# Patient Record
Sex: Female | Born: 1960 | Race: Black or African American | Hispanic: No | Marital: Single | Smoking: Current every day smoker
Health system: Southern US, Community
[De-identification: ages and names within clinical notes are randomized; demographics above are authoritative.]

## PROBLEM LIST (undated history)

## (undated) ENCOUNTER — Emergency Department (HOSPITAL_BASED_OUTPATIENT_CLINIC_OR_DEPARTMENT_OTHER): Admission: EM | Payer: Medicare Other | Source: Home / Self Care

## (undated) DIAGNOSIS — E119 Type 2 diabetes mellitus without complications: Secondary | ICD-10-CM

## (undated) DIAGNOSIS — I219 Acute myocardial infarction, unspecified: Secondary | ICD-10-CM

## (undated) DIAGNOSIS — I2510 Atherosclerotic heart disease of native coronary artery without angina pectoris: Secondary | ICD-10-CM

## (undated) DIAGNOSIS — I10 Essential (primary) hypertension: Secondary | ICD-10-CM

## (undated) HISTORY — PX: OTHER SURGICAL HISTORY: SHX169

## (undated) HISTORY — PX: CORONARY ARTERY BYPASS GRAFT: SHX141

---

## 2010-11-23 ENCOUNTER — Emergency Department (HOSPITAL_BASED_OUTPATIENT_CLINIC_OR_DEPARTMENT_OTHER)
Admission: EM | Admit: 2010-11-23 | Discharge: 2010-11-24 | Disposition: A | Discharge status: Home / Self Care | Payer: Self-pay | Attending: Emergency Medicine | Admitting: Emergency Medicine

## 2010-11-23 DIAGNOSIS — E7800 Pure hypercholesterolemia, unspecified: Secondary | ICD-10-CM | POA: Insufficient documentation

## 2010-11-23 DIAGNOSIS — B86 Scabies: Secondary | ICD-10-CM | POA: Insufficient documentation

## 2010-11-23 DIAGNOSIS — Z79899 Other long term (current) drug therapy: Secondary | ICD-10-CM | POA: Insufficient documentation

## 2010-11-23 DIAGNOSIS — I10 Essential (primary) hypertension: Secondary | ICD-10-CM | POA: Insufficient documentation

## 2010-11-23 DIAGNOSIS — I252 Old myocardial infarction: Secondary | ICD-10-CM | POA: Insufficient documentation

## 2014-04-15 ENCOUNTER — Emergency Department (HOSPITAL_BASED_OUTPATIENT_CLINIC_OR_DEPARTMENT_OTHER)
Admission: EM | Admit: 2014-04-15 | Discharge: 2014-04-15 | Disposition: A | Discharge status: Home / Self Care | Payer: Medicare Other | Attending: Emergency Medicine | Admitting: Emergency Medicine

## 2014-04-15 ENCOUNTER — Encounter (HOSPITAL_BASED_OUTPATIENT_CLINIC_OR_DEPARTMENT_OTHER): Payer: Self-pay | Admitting: Emergency Medicine

## 2014-04-15 ENCOUNTER — Emergency Department (HOSPITAL_BASED_OUTPATIENT_CLINIC_OR_DEPARTMENT_OTHER): Payer: Medicare Other

## 2014-04-15 DIAGNOSIS — J209 Acute bronchitis, unspecified: Secondary | ICD-10-CM | POA: Insufficient documentation

## 2014-04-15 DIAGNOSIS — R059 Cough, unspecified: Secondary | ICD-10-CM

## 2014-04-15 DIAGNOSIS — Z9861 Coronary angioplasty status: Secondary | ICD-10-CM | POA: Diagnosis not present

## 2014-04-15 DIAGNOSIS — R05 Cough: Secondary | ICD-10-CM

## 2014-04-15 DIAGNOSIS — Z880 Allergy status to penicillin: Secondary | ICD-10-CM | POA: Diagnosis not present

## 2014-04-15 DIAGNOSIS — I10 Essential (primary) hypertension: Secondary | ICD-10-CM | POA: Diagnosis not present

## 2014-04-15 DIAGNOSIS — E119 Type 2 diabetes mellitus without complications: Secondary | ICD-10-CM | POA: Insufficient documentation

## 2014-04-15 DIAGNOSIS — Z79899 Other long term (current) drug therapy: Secondary | ICD-10-CM | POA: Insufficient documentation

## 2014-04-15 DIAGNOSIS — I252 Old myocardial infarction: Secondary | ICD-10-CM | POA: Insufficient documentation

## 2014-04-15 DIAGNOSIS — J40 Bronchitis, not specified as acute or chronic: Secondary | ICD-10-CM

## 2014-04-15 HISTORY — DX: Type 2 diabetes mellitus without complications: E11.9

## 2014-04-15 HISTORY — DX: Essential (primary) hypertension: I10

## 2014-04-15 HISTORY — DX: Atherosclerotic heart disease of native coronary artery without angina pectoris: I25.10

## 2014-04-15 MED ORDER — ALBUTEROL SULFATE (2.5 MG/3ML) 0.083% IN NEBU
5.0000 mg | INHALATION_SOLUTION | Freq: Once | RESPIRATORY_TRACT | Status: AC
  Administered 2014-04-15: 5 mg via RESPIRATORY_TRACT
  Filled 2014-04-15: qty 6

## 2014-04-15 MED ORDER — PREDNISONE 50 MG PO TABS
60.0000 mg | ORAL_TABLET | Freq: Once | ORAL | Status: AC
  Administered 2014-04-15: 60 mg via ORAL
  Filled 2014-04-15 (×2): qty 1

## 2014-04-15 MED ORDER — AZITHROMYCIN 250 MG PO TABS: ORAL_TABLET | ORAL | Status: AC

## 2014-04-15 MED ORDER — PREDNISONE 20 MG PO TABS: ORAL_TABLET | ORAL | Status: AC

## 2014-04-15 MED ORDER — BENZONATATE 100 MG PO CAPS: 100.0000 mg | ORAL_CAPSULE | Freq: Three times a day (TID) | ORAL | Status: AC | PRN

## 2014-04-15 MED ORDER — ALBUTEROL SULFATE HFA 108 (90 BASE) MCG/ACT IN AERS
2.0000 | INHALATION_SPRAY | Freq: Once | RESPIRATORY_TRACT | Status: AC
  Administered 2014-04-15: 2 via RESPIRATORY_TRACT
  Filled 2014-04-15: qty 6.7

## 2014-04-15 NOTE — Discharge Instructions (Signed)
Take prednisone as prescribed.   Use Zpack as prescribed.   Stay hydrated.   Use albuterol every 4 hrs as needed.   Try tessalon pearls as needed, especially at night.   Watch your blood sugar closely as it may go up with prednisone.   Follow up with your doctor.   Return to ER if you have trouble breathing, cough, chest pain.

## 2014-04-15 NOTE — ED Notes (Signed)
Patient states she developed a head cold one week ago and improved.  Now has productive cough with white to dark secretions.  Coughing is keeping the patient awake at night. Denies fevers.

## 2014-04-15 NOTE — ED Provider Notes (Signed)
CSN: [id number]     Arrival date & time 04/15/14  1530 History   First MD Initiated Contact with Patient 04/15/14 1554     Chief Complaint  Patient presents with  . Cough     (Consider location/radiation/quality/duration/timing/severity/associated sxs/prior Treatment) The history is provided by the patient.  Nicole CrumbleVivian Chirico is a 53 y.o. female hx of MI, DM, HTN here with cough and congestion. Has been having productive cough with white secretions over the last week. She has coughed in the day but at night it wakes her up. He tried some over-the-counter meds with no relief. She had some chest pain or week ago that resolved. States that this is different than her previous MIs. She saw her cardiologist today it was cleared by her cardiologist. Denies any fevers. She stopped smoking for a while but has recently started.    Past Medical History  Diagnosis Date  . MI     x 3  . Diabetes mellitus without complication   . Hypertension    Past Surgical History  Procedure Laterality Date  . Cesarean section      x 4  . Cardiac stents      x 3   No family history on file. History  Substance Use Topics  . Smoking status: Not on file  . Smokeless tobacco: Not on file  . Alcohol Use: Not on file   OB History   Grav Para Term Preterm Abortions TAB SAB Ect Mult Living                 Review of Systems  Respiratory: Positive for cough.   All other systems reviewed and are negative.     Allergies  Penicillins  Home Medications   Prior to Admission medications   Medication Sig Start Date End Date Taking? Authorizing Provider  aspirin 81 MG tablet Take 81 mg by mouth daily.   Yes Historical Provider, MD  atorvastatin (LIPITOR) 80 MG tablet Take 80 mg by mouth daily.   Yes Historical Provider, MD  clopidogrel (PLAVIX) 75 MG tablet Take 75 mg by mouth daily.   Yes Historical Provider, MD  gabapentin (NEURONTIN) 100 MG capsule Take 100 mg by mouth 3 (three) times daily.   Yes  Historical Provider, MD  lisinopril (PRINIVIL,ZESTRIL) 2.5 MG tablet Take 2.5 mg by mouth daily.   Yes Historical Provider, MD  metFORMIN (GLUCOPHAGE) 500 MG tablet Take by mouth 2 (two) times daily with a meal.   Yes Historical Provider, MD  metoprolol (LOPRESSOR) 50 MG tablet Take 50 mg by mouth 2 (two) times daily.   Yes Historical Provider, MD  nitroGLYCERIN (NITROSTAT) 0.4 MG SL tablet Place 0.4 mg under the tongue every 5 (five) minutes as needed for chest pain.   Yes Historical Provider, MD  sertraline (ZOLOFT) 50 MG tablet Take 50 mg by mouth daily.   Yes Historical Provider, MD   BP 162/82  Pulse 86  Temp(Src) 98.5 F (36.9 C) (Oral)  Resp 18  Ht 5' (1.524 m)  Wt 185 lb (83.915 kg)  BMI 36.13 kg/m2  SpO2 100% Physical Exam  Nursing note and vitals reviewed. Constitutional: She is oriented to person, place, and time.  Chronically ill, tired   HENT:  Head: Normocephalic.  Mouth/Throat: Oropharynx is clear and moist.  Eyes: Conjunctivae and EOM are normal. Pupils are equal, round, and reactive to light.  Neck: Normal range of motion. Neck supple.  Cardiovascular: Normal rate, regular rhythm and normal heart sounds.  Pulmonary/Chest:  + diffuse wheezing, no crackles. No retractions. Slightly tachypneic   Abdominal: Soft. Bowel sounds are normal. She exhibits no distension. There is no tenderness. There is no rebound and no guarding.  Musculoskeletal: Normal range of motion. She exhibits no edema and no tenderness.  Neurological: She is alert and oriented to person, place, and time. No cranial nerve deficit. Coordination normal.  Skin: Skin is warm.  Psychiatric: She has a normal mood and affect. Her behavior is normal. Judgment and thought content normal.    ED Course  Procedures (including critical care time) Labs Review Labs Reviewed - No data to display  Imaging Review Dg Chest 2 View  04/15/2014   CLINICAL DATA:  Head cold for 1 week, productive cough  EXAM: CHEST   2 VIEW  COMPARISON:  None.  FINDINGS: The heart size and mediastinal contours are within normal limits. Both lungs are clear. The visualized skeletal structures are unremarkable.  IMPRESSION: No active cardiopulmonary disease.   Electronically Signed   By: Elige KoHetal  Patel   On: 04/15/2014 16:07     EKG Interpretation None      MDM   Final diagnoses:  Cough    Nicole Solis is a 53 y.o. female here with cough, congestion. I think likely bronchitis vs pneumonia. No chest pain now. I doubt MI or ACS.   4:41 PM CXR showed no obvious infiltrate. Minimal wheezing after 1 neb. Given that she is a smoker, will give albuterol, prednisone, zpack empirically. Told her to watch her sugars closely and stop smoking.   Richardean Canalavid H Porter Moes, MD 04/15/14 [phone]

## 2014-07-11 ENCOUNTER — Emergency Department (HOSPITAL_BASED_OUTPATIENT_CLINIC_OR_DEPARTMENT_OTHER)
Admission: EM | Admit: 2014-07-11 | Discharge: 2014-07-11 | Disposition: A | Discharge status: Home / Self Care | Payer: Medicare Other | Attending: Emergency Medicine | Admitting: Emergency Medicine

## 2014-07-11 ENCOUNTER — Encounter (HOSPITAL_BASED_OUTPATIENT_CLINIC_OR_DEPARTMENT_OTHER): Payer: Self-pay | Admitting: *Deleted

## 2014-07-11 DIAGNOSIS — I10 Essential (primary) hypertension: Secondary | ICD-10-CM | POA: Diagnosis not present

## 2014-07-11 DIAGNOSIS — Z7902 Long term (current) use of antithrombotics/antiplatelets: Secondary | ICD-10-CM | POA: Diagnosis not present

## 2014-07-11 DIAGNOSIS — Z79899 Other long term (current) drug therapy: Secondary | ICD-10-CM | POA: Diagnosis not present

## 2014-07-11 DIAGNOSIS — Z720 Tobacco use: Secondary | ICD-10-CM | POA: Diagnosis not present

## 2014-07-11 DIAGNOSIS — Z7982 Long term (current) use of aspirin: Secondary | ICD-10-CM | POA: Diagnosis not present

## 2014-07-11 DIAGNOSIS — K0889 Other specified disorders of teeth and supporting structures: Secondary | ICD-10-CM

## 2014-07-11 DIAGNOSIS — K088 Other specified disorders of teeth and supporting structures: Secondary | ICD-10-CM | POA: Insufficient documentation

## 2014-07-11 DIAGNOSIS — I252 Old myocardial infarction: Secondary | ICD-10-CM | POA: Diagnosis not present

## 2014-07-11 DIAGNOSIS — Z880 Allergy status to penicillin: Secondary | ICD-10-CM | POA: Diagnosis not present

## 2014-07-11 DIAGNOSIS — E119 Type 2 diabetes mellitus without complications: Secondary | ICD-10-CM | POA: Diagnosis not present

## 2014-07-11 HISTORY — DX: Acute myocardial infarction, unspecified: I21.9

## 2014-07-11 MED ORDER — CLINDAMYCIN HCL 150 MG PO CAPS: 150.0000 mg | ORAL_CAPSULE | Freq: Three times a day (TID) | ORAL | Status: AC

## 2014-07-11 MED ORDER — HYDROCODONE-ACETAMINOPHEN 5-325 MG PO TABS
1.0000 | ORAL_TABLET | Freq: Once | ORAL | Status: AC
  Administered 2014-07-11: 1 via ORAL
  Filled 2014-07-11: qty 1

## 2014-07-11 MED ORDER — CLINDAMYCIN HCL 150 MG PO CAPS
300.0000 mg | ORAL_CAPSULE | Freq: Once | ORAL | Status: AC
  Administered 2014-07-11: 300 mg via ORAL
  Filled 2014-07-11: qty 2

## 2014-07-11 MED ORDER — HYDROCODONE-ACETAMINOPHEN 5-325 MG PO TABS: 1.0000 | ORAL_TABLET | Freq: Four times a day (QID) | ORAL | Status: DC | PRN

## 2014-07-11 NOTE — ED Notes (Signed)
C/o bottom tooth pain that started around Christmas but worse this past week. States entire left side of face hurting. Pt. Only has one tooth on the top left and none on bottom back. States she had these teeth removed 2 years ago. Denies fevers. Describes as a throbbing type pain which is worse at night. No other complaints. States she feels like her face is swollen.

## 2014-07-11 NOTE — ED Provider Notes (Signed)
CSN: [id number]     Arrival date & time 07/11/14  0516 History   First MD Initiated Contact with Patient 07/11/14 0602     Chief Complaint  Patient presents with  . Dental Pain     (Consider location/radiation/quality/duration/timing/severity/associated sxs/prior Treatment) HPI This is a 54 year old female who has very few teeth remaining after multiple extractions about 2 years ago. She is here complaining of pain at the site of a left lower molar extraction. She states the pain has been present since Christmas but has worsened over the last week. She describes it as "excruciating" and throbbing in nature, worse at night. It is worse with eating. It radiates to the left side of her face. She feels like the left side of her face is swollen. She denies a fever.  Past Medical History  Diagnosis Date  . MI     x 3  . Diabetes mellitus without complication   . Hypertension   . Myocardial infarct    Past Surgical History  Procedure Laterality Date  . Cesarean section      x 4  . Cardiac stents      x 3   No family history on file. History  Substance Use Topics  . Smoking status: Current Every Day Smoker  . Smokeless tobacco: Not on file  . Alcohol Use: No   OB History    No data available     Review of Systems  All other systems reviewed and are negative.   Allergies  Penicillins  Home Medications   Prior to Admission medications   Medication Sig Start Date End Date Taking? Authorizing Provider  aspirin 81 MG tablet Take 81 mg by mouth daily.    Historical Provider, MD  atorvastatin (LIPITOR) 80 MG tablet Take 80 mg by mouth daily.    Historical Provider, MD  azithromycin (ZITHROMAX Z-PAK) 250 MG tablet 2 po day one, then 1 daily x 4 days 04/15/14   Richardean Canalavid H Yao, MD  benzonatate (TESSALON) 100 MG capsule Take 1 capsule (100 mg total) by mouth 3 (three) times daily as needed for cough. 04/15/14   Richardean Canalavid H Yao, MD  clopidogrel (PLAVIX) 75 MG tablet Take 75 mg by mouth  daily.    Historical Provider, MD  gabapentin (NEURONTIN) 100 MG capsule Take 100 mg by mouth 3 (three) times daily.    Historical Provider, MD  lisinopril (PRINIVIL,ZESTRIL) 2.5 MG tablet Take 2.5 mg by mouth daily.    Historical Provider, MD  metFORMIN (GLUCOPHAGE) 500 MG tablet Take by mouth 2 (two) times daily with a meal.    Historical Provider, MD  metoprolol (LOPRESSOR) 50 MG tablet Take 50 mg by mouth 2 (two) times daily.    Historical Provider, MD  nitroGLYCERIN (NITROSTAT) 0.4 MG SL tablet Place 0.4 mg under the tongue every 5 (five) minutes as needed for chest pain.    Historical Provider, MD  predniSONE (DELTASONE) 20 MG tablet Take 40 mg daily x 2 days then 20 mg daily x 2 days 04/15/14   Richardean Canalavid H Yao, MD  sertraline (ZOLOFT) 50 MG tablet Take 50 mg by mouth daily.    Historical Provider, MD   BP 124/66 mmHg  Pulse 61  Temp(Src) 98.5 F (36.9 C) (Oral)  Resp 18  SpO2 98%   Physical Exam  General: Well-developed, well-nourished female in no acute distress; appearance consistent with age of record HENT: normocephalic; atraumatic; multiple missing teeth; tenderness at site of left lower second or third molar  without appreciable swelling or fluctuance Eyes: pupils equal, round and reactive to light; extraocular muscles intact Neck: supple; no lymphadenopathy Heart: regular rate and rhythm Lungs: clear to auscultation bilaterally Abdomen: soft; nondistended Extremities: No deformity; full range of motion Neurologic: Awake, alert and oriented; motor function intact in all extremities and symmetric; no facial droop Skin: Warm and dry Psychiatric: Normal mood and affect    ED Course  Procedures (including critical care time)   MDM  6:12 AM Patient was advised she needs to follow-up with a dentist as she likely needs dental x-rays which we are not set up to perform. We will treat her symptomatically in the meantime as well as provide an antibiotic for possible infectious  etiology.  Hanley Seamen, MD 07/11/14 [phone]

## 2014-07-11 NOTE — ED Notes (Signed)
MD with pt  

## 2014-07-11 NOTE — Discharge Instructions (Signed)
°Emergency Department Resource Guide °1) Find a Doctor and Pay Out of Pocket °Although you won't have to find out who is covered by your insurance plan, it is a good idea to ask around and get recommendations. You will then need to call the office and see if the doctor you have chosen will accept you as a new patient and what types of options they offer for patients who are self-pay. Some doctors offer discounts or will set up payment plans for their patients who do not have insurance, but you will need to ask so you aren't surprised when you get to your appointment. ° °2) Contact Your Local Health Department °Not all health departments have doctors that can see patients for sick visits, but many do, so it is worth a call to see if yours does. If you don't know where your local health department is, you can check in your phone book. The CDC also has a tool to help you locate your state's health department, and many state websites also have listings of all of their local health departments. ° °3) Find a Walk-in Clinic °If your illness is not likely to be very severe or complicated, you may want to try a walk in clinic. These are popping up all over the country in pharmacies, drugstores, and shopping centers. They're usually staffed by nurse practitioners or physician assistants that have been trained to treat common illnesses and complaints. They're usually fairly quick and inexpensive. However, if you have serious medical issues or chronic medical problems, these are probably not your best option. ° °No Primary Care Doctor: °- Call [facility] at  832-8000 - they can help you locate a primary care doctor that  accepts your insurance, provides certain services, etc. °- Physician Referral Service- 1-800-533-3463 ° °Chronic Pain Problems: °Organization         Address  Phone   Notes  °[facility]  ([phone]) [phone] Patients need to be referred by their primary care doctor.  ° °Medication  Assistance: °Organization         Address  Phone   Notes  °[place] [facility] [address] °[place], [place] [address] (336) 641-8030 --Must be a resident of [place] °-- Must have NO insurance coverage whatsoever (no Medicaid/ Medicare, etc.) °-- The pt. MUST have a primary care doctor that directs their care regularly and follows them in the community °  °[facility]  (866) 331-1348   °[employer]  (888) 892-1162   ° °Agencies that provide inexpensive medical care: °Organization         Address  Phone   Notes  °[facility]  (336) 832-8035   °[facility]    (336) 832-7272   °[facility] [address] °[place], [place] [address] (336) 832-4777   °[facility] [address], °[place] (336) 271-4999   °[facility]    (336) 373-0678   °[facility]    (336) 272-1050   °[facility] ° [address], [place] Phone:  (336) 832-4444, Fax:  (336) 832-4440 Hours of Operation:  9 am - 6 pm, M-F.  Also accepts Medicaid/Medicare and self-pay.  °[facility] ° [address], [place] Phone: (336) [phone], Fax: (336) [phone]. Hours of Operation:  8:30 am - 5:30 pm, M-F.  Also accepts Medicaid and self-pay.  °[facility] [place] [address], [place] Phone: (336) 878-6027   °[facility] [address], [place], [place] (336)723-1848, Ext. 123 Mondays & Thursdays: 7-9 AM.  First 15 patients are seen on a first come, first serve basis. °  ° °Medicaid-accepting [place] Providers: ° °Organization         Address  Phone   Notes  °[facility] [address], [place] (336) 641-2100 Also accepts self-pay patients.  °[facility] [address], [place] ° (336) 856-9996   °[facility] [address], [place]  (336) 288-8857   °[facility] [address] [place] [address], [place] (336) 299-7000   °Veita Bland [address], [place]  ° (336) 373-1557 Only accepts [place] Access Medicaid patients after they have their name applied to their card.  ° °Self-Pay (no insurance) in [place]: ° °Organization         Address  Phone   Notes  °Sickle Cell Patients, [facility] [address], [place] (336) 832-1970   °[facility] [address], [place] (336) 832-4400   °[facility] ° [address], [place] (336) 992-4800   °[facility]/Dr. Osei-Bonsu ° [address] [place] [address], [place] or [address], [place] (336) [phone] Phone number for both [place] and [place] locations is the same.  °[facility] [address], [place] (336) 299-0000   °[facility] [address] [place] [address], [place] or [address] (336) 852-7530 °(336) 878-2260   °[facility] [address], [place] (336) 350-1642, phone; (336) 294-5005, fax Sees patients 1st and 3rd Saturday of every month.  Must not qualify for public or private insurance (i.e. Medicaid, Medicare, [place] Health Choice, Veterans' Benefits) • Household income should be no more than 200% of the poverty level •The clinic cannot treat you if you are pregnant or think you are pregnant • Sexually transmitted diseases are not treated at the clinic.  ° ° °Dental Care: °Organization         Address  Phone  Notes  °[place] [facility] [address], [place] (336) 641-6152 Accepts children up to age 21 who are enrolled in Medicaid or [place] Health Choice; pregnant women with a Medicaid card; and children who have applied for Medicaid or [place] Health Choice, but were declined, whose parents can pay a reduced fee at time of service.  °[place]  [facility] [place]  [address], [place] (336) 641-7733 Accepts children up to age 21 who are enrolled in Medicaid or [place] Health Choice; pregnant women with a Medicaid card; and children who have applied for Medicaid or [place] Health Choice, but were declined, whose parents can pay a reduced fee at time of service.  °[facility] ° [address], [place] (336) 641-4533 Patients are seen by appointment only. Walk-ins are not accepted. [facility] will see patients 18 years of age and older. °Monday - Tuesday (8am-5pm) °Most Wednesdays (8:30-5pm) °$30 per visit, cash only  °[facility] ° [address], [place] (336) 641-4533 Patients are seen by appointment only. Walk-ins are not accepted. [facility] will see patients 18 years of age and older. °One   Wednesday Evening (Monthly: Volunteer Based).  $30 per visit, cash only  °[facility]  (919) 537-3737 for adults; Children under age 4, call Graduate Pediatric Dentistry at (919) [phone]. Children aged 4-14, please call (919) [phone] to request a pediatric application. ° Dental services are provided in all areas of dental care including fillings, crowns and bridges, complete and partial dentures, implants, gum treatment, root canals, and extractions. Preventive care is also provided. Treatment is provided to both adults and children. °Patients are selected via a lottery and there is often a waiting list. °  °[facility] [address], °[place] ° (336) 763-8833 [email or web address] °  °[facility] [address], [place], [place] (336)723-1848, Ext. 123 Second and Fourth Thursday of each month, opens at 6:30 AM; Clinic ends at 9 AM.  Patients are seen on a first-come first-served basis, and a limited number are seen during each clinic.  ° °[facility] ° [address], [place], [place] (336) 723-7904    Eligibility Requirements °You must have lived in [place], [place], or [place] counties for at least the last three months. °  You cannot be eligible for state or federal sponsored healthcare insurance, including Veterans Administration, Medicaid, or Medicare. °  You generally cannot be eligible for healthcare insurance through your employer.  °  How to apply: °Eligibility screenings are held every Tuesday and Wednesday afternoon from 1:00 pm until 4:00 pm. You do not need an appointment for the interview!  °[facility] [address], [place], [place] 336-631-2330   °[facility]  336-342-8273   °[facility]  336-703-3100   °[facility]  336-570-6415   ° °Behavioral Health Resources in the Community: °Intensive Outpatient Programs °Organization         Address  Phone  Notes  °[facility] [address], [place], [place] 336-878-6098   °[facility] [address], [place], [place] 336-832-9800   °[facility] [address], [place], [place] ° 336-882-2125   °[facility] [address],  °[place], [place] 1-800-853-5163 or 336-641-4981   °Substance Abuse Resources °Organization         Address  Phone  Notes  °[facility]  336-882-2125   °[facility]  336-784-9470   °[facility]  336-285-9073   °[facility]  336-845-3988   °Residential & Outpatient Substance Abuse Program  1-800-659-3381   °Psychological Services °Organization         Address  Phone  Notes  °[facility]  336- 832-9600   °[facility]  336- 378-7881   °[facility] [address], [place] 1-800-853-5163 or 336-641-4981   ° °Mobile Crisis Teams °Organization         Address  Phone  Notes  °[facility], Mobile Crisis Care Unit  1-877-626-1772   °Assertive °[facility] ° [address]  [place], [place] 336-834-9664   °Sharon DeEsch [address] °[place] 336-554-5454   ° °Self-Help/Support Groups °Organization         Address  Phone             Notes  °[facility] - variety of support groups  336- 373-1402 Call for more information  °Narcotics Anonymous (NA), [facility] [address], °[place]  2 meetings at this location  ° °  Residential Treatment Programs °Organization         Address  Phone  Notes  °[facility] [address],    °[place]  1-866-801-8205   °[facility] ° [address], [place], [place] 704-293-8524   °[facility] [address], [place] 336-845-3988 Admissions: 8am-3pm M-F  °[facility] [address]    °[place], [place] 336-841-1104   °[facility] [address], [place], [place] 336-379-7146   °[facility] [address]  °[place], [place] 336-285-9073   °[facility] - Intensive Outpatient [address], [place], [place] 336-852-3033   °[facility] [address]  °[place], [place] 1-877-615-2722 or 336-784-9470   °[facility] [address] [place], [place] 336-227-7417 Accepts Medicaid  °[facility] [address]  °[place] 1-800-659-3381 Substance Abuse/Addiction Treatment  ° °[place] Behavioral Health Resources °Organization         Address  Phone  Notes  °[facility]  (888) 581-9988   °Julie Brannon, PhD [address] [place], [place]   (336) 349-5553 or (336) 951-0000   °[facility]   [address] °[place], [place] (336) 349-4454   °[facility] [address], [place], [place] (336) 342-8316 Insurance/Medicaid/sponsorship through [facility]  °[facility] [address]                                    [place], [place] (336) 342-8316 Therapy/tele-psych/case    °[facility] [address]  ° [place], [place] (336) 349-2233    °Dr. Arfeen  (336) 349-4544   °[facility] [place]  [employer] [place] [facility] 1) [address],  °2) [address], [place] °3)  [address], [place] (336) 349-3220 °(336) 342-7768 ° °(336) 342-8140   °[place] Child Abuse Hotline (336) 342-1394 or (336) 342-3537 (After Hours)    ° ° °

## 2014-09-04 ENCOUNTER — Encounter (HOSPITAL_BASED_OUTPATIENT_CLINIC_OR_DEPARTMENT_OTHER): Payer: Self-pay

## 2014-09-04 ENCOUNTER — Emergency Department (HOSPITAL_BASED_OUTPATIENT_CLINIC_OR_DEPARTMENT_OTHER)
Admission: EM | Admit: 2014-09-04 | Discharge: 2014-09-05 | Disposition: A | Discharge status: Home / Self Care | Payer: Medicare Other | Attending: Emergency Medicine | Admitting: Emergency Medicine

## 2014-09-04 DIAGNOSIS — Z9861 Coronary angioplasty status: Secondary | ICD-10-CM | POA: Diagnosis not present

## 2014-09-04 DIAGNOSIS — Z79899 Other long term (current) drug therapy: Secondary | ICD-10-CM | POA: Diagnosis not present

## 2014-09-04 DIAGNOSIS — E119 Type 2 diabetes mellitus without complications: Secondary | ICD-10-CM | POA: Diagnosis not present

## 2014-09-04 DIAGNOSIS — Z880 Allergy status to penicillin: Secondary | ICD-10-CM | POA: Insufficient documentation

## 2014-09-04 DIAGNOSIS — Z7982 Long term (current) use of aspirin: Secondary | ICD-10-CM | POA: Diagnosis not present

## 2014-09-04 DIAGNOSIS — I10 Essential (primary) hypertension: Secondary | ICD-10-CM | POA: Insufficient documentation

## 2014-09-04 DIAGNOSIS — M25551 Pain in right hip: Secondary | ICD-10-CM | POA: Diagnosis not present

## 2014-09-04 DIAGNOSIS — G629 Polyneuropathy, unspecified: Secondary | ICD-10-CM | POA: Diagnosis not present

## 2014-09-04 DIAGNOSIS — I2510 Atherosclerotic heart disease of native coronary artery without angina pectoris: Secondary | ICD-10-CM | POA: Diagnosis not present

## 2014-09-04 DIAGNOSIS — Z951 Presence of aortocoronary bypass graft: Secondary | ICD-10-CM | POA: Insufficient documentation

## 2014-09-04 DIAGNOSIS — M25552 Pain in left hip: Secondary | ICD-10-CM | POA: Diagnosis not present

## 2014-09-04 DIAGNOSIS — I252 Old myocardial infarction: Secondary | ICD-10-CM | POA: Diagnosis not present

## 2014-09-04 DIAGNOSIS — Z720 Tobacco use: Secondary | ICD-10-CM | POA: Diagnosis not present

## 2014-09-04 DIAGNOSIS — M79605 Pain in left leg: Secondary | ICD-10-CM | POA: Diagnosis present

## 2014-09-04 MED ORDER — HYDROCODONE-ACETAMINOPHEN 5-325 MG PO TABS: 1.0000 | ORAL_TABLET | Freq: Four times a day (QID) | ORAL | Status: AC | PRN

## 2014-09-04 MED ORDER — OXYCODONE-ACETAMINOPHEN 5-325 MG PO TABS
1.0000 | ORAL_TABLET | Freq: Once | ORAL | Status: AC
  Administered 2014-09-04: 1 via ORAL
  Filled 2014-09-04: qty 1

## 2014-09-04 NOTE — Discharge Instructions (Signed)
Continue regular medications. Take prescribed pain medication for severe pain only. Follow up with your primary care doctor and orthopedics as referred. Return if worsening symptoms.   Hip Pain Your hip is the joint between your upper legs and your lower pelvis. The bones, cartilage, tendons, and muscles of your hip joint perform a lot of work each day supporting your body weight and allowing you to move around. Hip pain can range from a minor ache to severe pain in one or both of your hips. Pain may be felt on the inside of the hip joint near the groin, or the outside near the buttocks and upper thigh. You may have swelling or stiffness as well.  HOME CARE INSTRUCTIONS   Take medicines only as directed by your health care provider.  Apply ice to the injured area:  Put ice in a plastic bag.  Place a towel between your skin and the bag.  Leave the ice on for 15-20 minutes at a time, 3-4 times a day.  Keep your leg raised (elevated) when possible to lessen swelling.  Avoid activities that cause pain.  Follow specific exercises as directed by your health care provider.  Sleep with a pillow between your legs on your most comfortable side.  Record how often you have hip pain, the location of the pain, and what it feels like. SEEK MEDICAL CARE IF:   You are unable to put weight on your leg.  Your hip is red or swollen or very tender to touch.  Your pain or swelling continues or worsens after 1 week.  You have increasing difficulty walking.  You have a fever. SEEK IMMEDIATE MEDICAL CARE IF:   You have fallen.  You have a sudden increase in pain and swelling in your hip. MAKE SURE YOU:   Understand these instructions.  Will watch your condition.  Will get help right away if you are not doing well or get worse. Document Released: 11/29/2009 Document Revised: 10/26/2013 Document Reviewed: 02/05/2013 [facility] Patient Information 2015 [place], [place]. This information is not  intended to replace advice given to you by your health care provider. Make sure you discuss any questions you have with your health care provider.

## 2014-09-04 NOTE — ED Notes (Signed)
Pt reports multiple months with neuropathy pain in bilateral legs with leg swelling bilateral.  States pain worse now and going from hips down to feet.  Reports her prescribed neurontin not working for pain control.  No difficulty ambulating.

## 2014-09-04 NOTE — ED Provider Notes (Signed)
CSN: [id number]     Arrival date & time 09/04/14  2045 History   First MD Initiated Contact with Patient 09/04/14 2302     Chief Complaint  Patient presents with  . Leg Pain     (Consider location/radiation/quality/duration/timing/severity/associated sxs/prior Treatment) HPI Nicole Solis is a 54 y.o. femalewith hx of CAD, DM, HTN, presents to ED with complaint of pain to bilateral legs and hips. States pain for several years, worse in the last month. Was seen by PCP 5 days ago. States they increased her neurontin dose. States it is not helping. Now with increased bilateral hip pain. States she has not had issues with her hips in the past. Pain worsened with movement of bilateral hips and walking. States "I can hardly get up." States she is only able to go to church because of pain. She denies any other associated symptoms. No weakness, no loss of bladder or bowel control, no fever. No abdominal pain or back pain.   Past Medical History  Diagnosis Date  . MI     x 3  . Diabetes mellitus without complication   . Hypertension   . Myocardial infarct    Past Surgical History  Procedure Laterality Date  . Cesarean section      x 4  . Cardiac stents      x 3  . Coronary artery bypass graft     No family history on file. History  Substance Use Topics  . Smoking status: Current Every Day Smoker -- 0.25 packs/day    Types: Cigarettes  . Smokeless tobacco: Not on file  . Alcohol Use: No   OB History    No data available     Review of Systems  Constitutional: Negative for fever and chills.  Respiratory: Negative for cough, chest tightness and shortness of breath.   Cardiovascular: Negative for chest pain, palpitations and leg swelling.  Gastrointestinal: Negative for nausea, vomiting, abdominal pain and diarrhea.  Genitourinary: Negative for dysuria, flank pain and pelvic pain.  Musculoskeletal: Positive for myalgias and arthralgias. Negative for neck pain and neck stiffness.   Skin: Negative for rash.  Neurological: Negative for dizziness, weakness, numbness and headaches.  All other systems reviewed and are negative.     Allergies  Penicillins  Home Medications   Prior to Admission medications   Medication Sig Start Date End Date Taking? Authorizing Provider  aspirin 81 MG tablet Take 81 mg by mouth daily.    Historical Provider, MD  atorvastatin (LIPITOR) 80 MG tablet Take 80 mg by mouth daily.    Historical Provider, MD  azithromycin (ZITHROMAX Z-PAK) 250 MG tablet 2 po day one, then 1 daily x 4 days 04/15/14   Richardean Canal, MD  benzonatate (TESSALON) 100 MG capsule Take 1 capsule (100 mg total) by mouth 3 (three) times daily as needed for cough. 04/15/14   Richardean Canal, MD  clindamycin (CLEOCIN) 150 MG capsule Take 1 capsule (150 mg total) by mouth 3 (three) times daily. 07/11/14   John Molpus, MD  clopidogrel (PLAVIX) 75 MG tablet Take 75 mg by mouth daily.    Historical Provider, MD  gabapentin (NEURONTIN) 100 MG capsule Take 100 mg by mouth 3 (three) times daily.    Historical Provider, MD  HYDROcodone-acetaminophen (NORCO/VICODIN) 5-325 MG per tablet Take 1-2 tablets by mouth every 6 (six) hours as needed (for pain). 07/11/14   John Molpus, MD  lisinopril (PRINIVIL,ZESTRIL) 2.5 MG tablet Take 2.5 mg by mouth daily.  Historical Provider, MD  metFORMIN (GLUCOPHAGE) 500 MG tablet Take by mouth 2 (two) times daily with a meal.    Historical Provider, MD  metoprolol (LOPRESSOR) 50 MG tablet Take 50 mg by mouth 2 (two) times daily.    Historical Provider, MD  nitroGLYCERIN (NITROSTAT) 0.4 MG SL tablet Place 0.4 mg under the tongue every 5 (five) minutes as needed for chest pain.    Historical Provider, MD  predniSONE (DELTASONE) 20 MG tablet Take 40 mg daily x 2 days then 20 mg daily x 2 days 04/15/14   Richardean Canalavid H Yao, MD  sertraline (ZOLOFT) 50 MG tablet Take 50 mg by mouth daily.    Historical Provider, MD   BP 118/60 mmHg  Pulse 68  Temp(Src) 98.5 F (36.9  C) (Oral)  Resp 18  Ht 5' (1.524 m)  Wt 176 lb (79.833 kg)  BMI 34.37 kg/m2  SpO2 96% Physical Exam  Constitutional: She is oriented to person, place, and time. She appears well-developed and well-nourished. No distress.  HENT:  Head: Normocephalic.  Eyes: Conjunctivae are normal.  Neck: Neck supple.  Cardiovascular: Normal rate, regular rhythm and normal heart sounds.   Pulmonary/Chest: Effort normal and breath sounds normal. No respiratory distress. She has no wheezes. She has no rales.  Abdominal: Soft. Bowel sounds are normal. She exhibits no distension. There is no tenderness. There is no rebound.  Musculoskeletal: She exhibits no edema.  Normal appearing bilateral legs. Normal and full rom of bilateral ankles and knees. TTP over bilateral lateral hips over greater trochanters. Pain with bilateral hip flexion, internal and external rotations.   Neurological: She is alert and oriented to person, place, and time.  5/5 and equal lower extremity strength. 2+ and equal patellar reflexes bilaterally. Pt able to dorsiflex bilateral toes and feet with good strength against resistance. Equal sensation bilaterally over thighs and lower legs.   Skin: Skin is warm and dry.  Psychiatric: She has a normal mood and affect. Her behavior is normal.  Nursing note and vitals reviewed.   ED Course  Procedures (including critical care time) Labs Review Labs Reviewed - No data to display  Imaging Review No results found.   EKG Interpretation None      MDM   Final diagnoses:  Bilateral hip pain  Neuropathy   patient with chronic neuropathy of bilateral legs. Followed by her primary care doctor. Patient is not happy with his treatment, states he is not treating her pain very well. I explained to her that she may need to go to a specialist or pain management for further pain managing. States the hip pain however is new. On my exam patient has tenderness to bilateral hips, pain with range of  motion in any direction. Question bursitis versus osteoarthritis. She is ambulatory. No injuries. Do not think any imaging indicated a nonemergent basis. Patient is requesting orthopedics referral. I will refer her to a sports medicine physician, she may benefit from some physical therapy. Instructed to also follow-up with her primary care doctor and explained to him that her pain is not controlled. She was given a Percocet in the emergency department. Home with 20 tablets of Norco. NO acute or emergent process. Outpatient follow up and tx appropriate  Filed Vitals:   09/04/14 2051  BP: 118/60  Pulse: 68  Temp: 98.5 F (36.9 C)  TempSrc: Oral  Resp: 18  Height: 5' (1.524 m)  Weight: 176 lb (79.833 kg)  SpO2: 96%     Jaynie Crumbleatyana Mikle Sternberg,  PA-C 09/04/14 2337  Arby Barrette, MD 09/09/14 [phone]

## 2015-03-08 IMAGING — CR DG CHEST 2V
2 series · 2 of 2 positions shown · non-contrast
Comparison: None.

CLINICAL DATA: Head cold for 1 week, productive cough

EXAM:
CHEST  2 VIEW

[w chest pa]
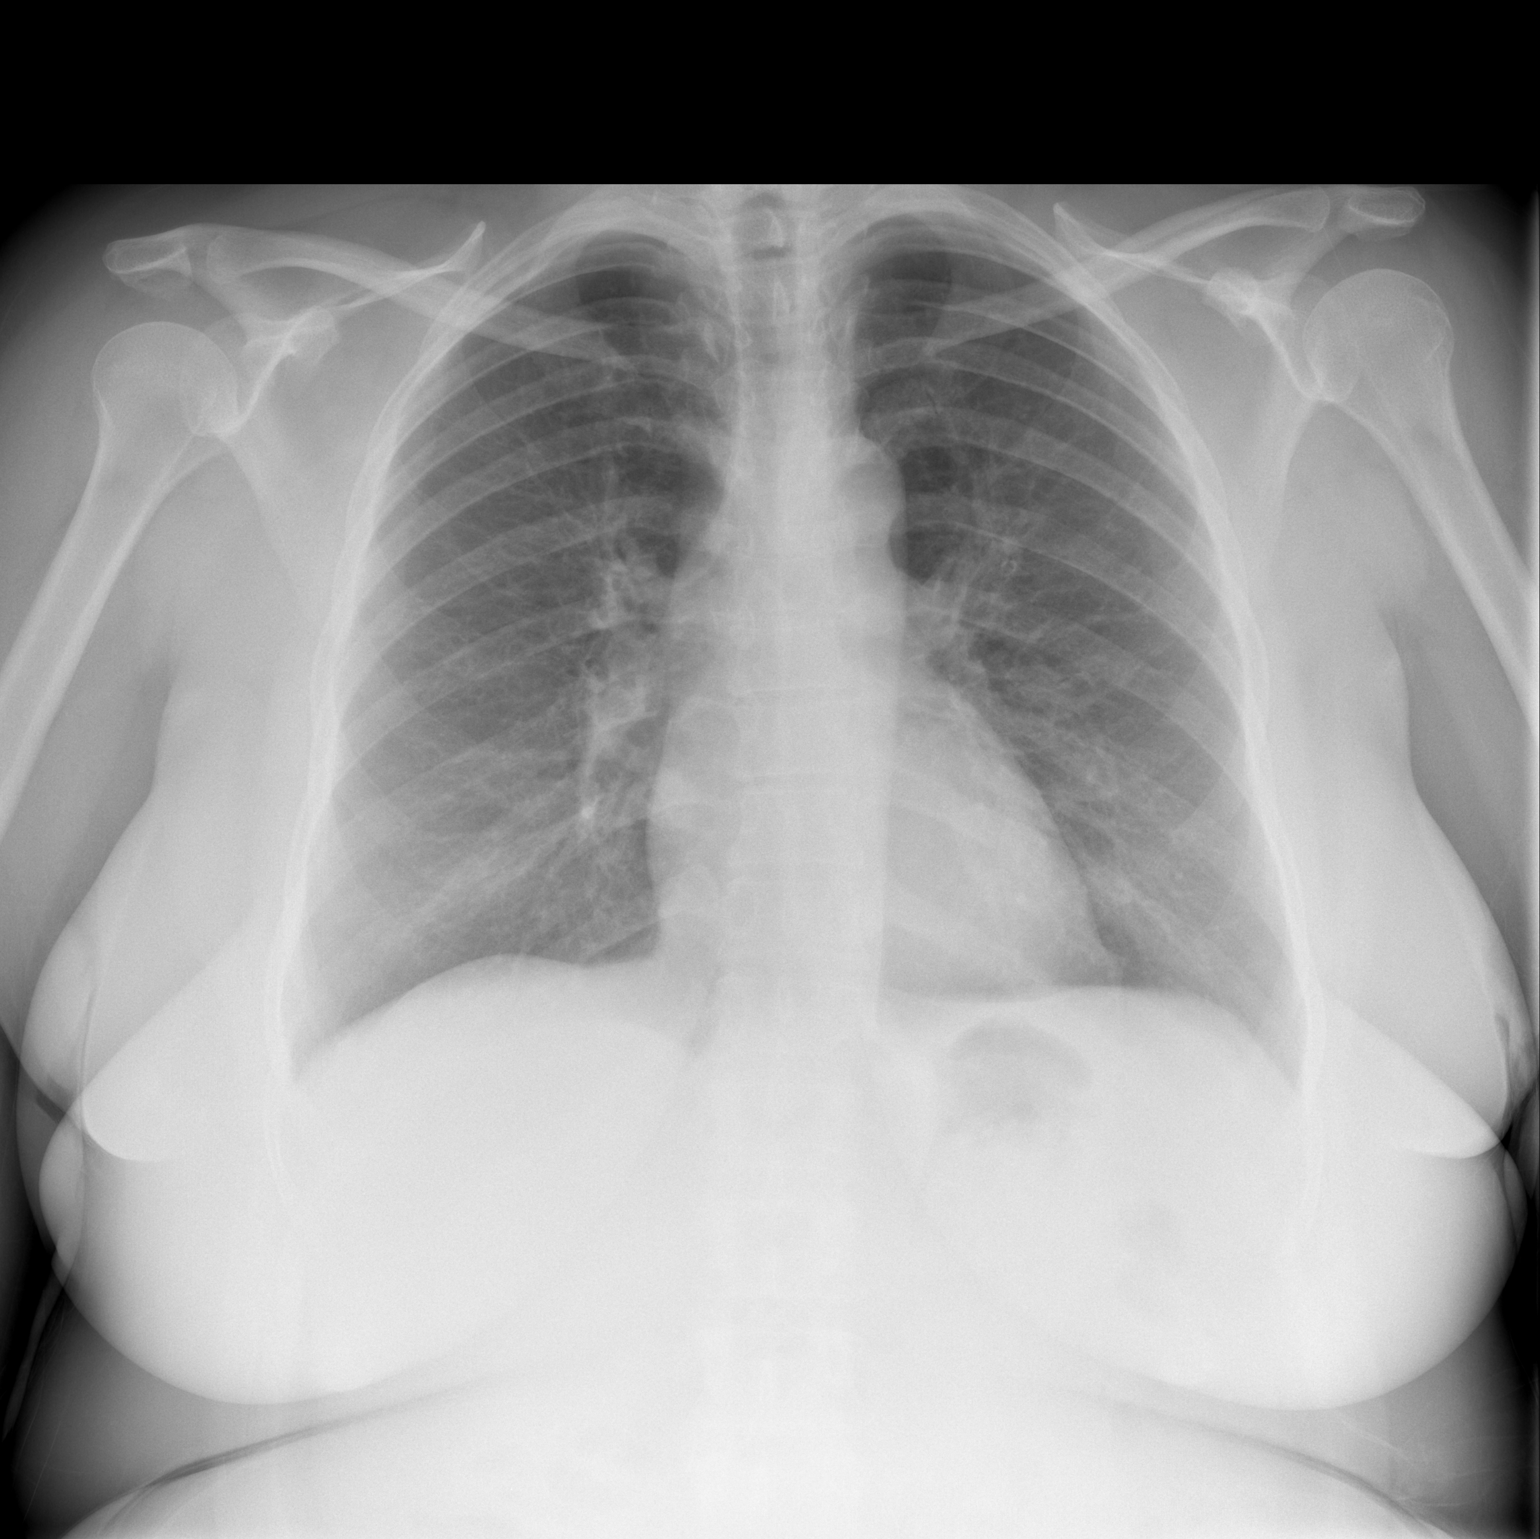

[w chest lat]
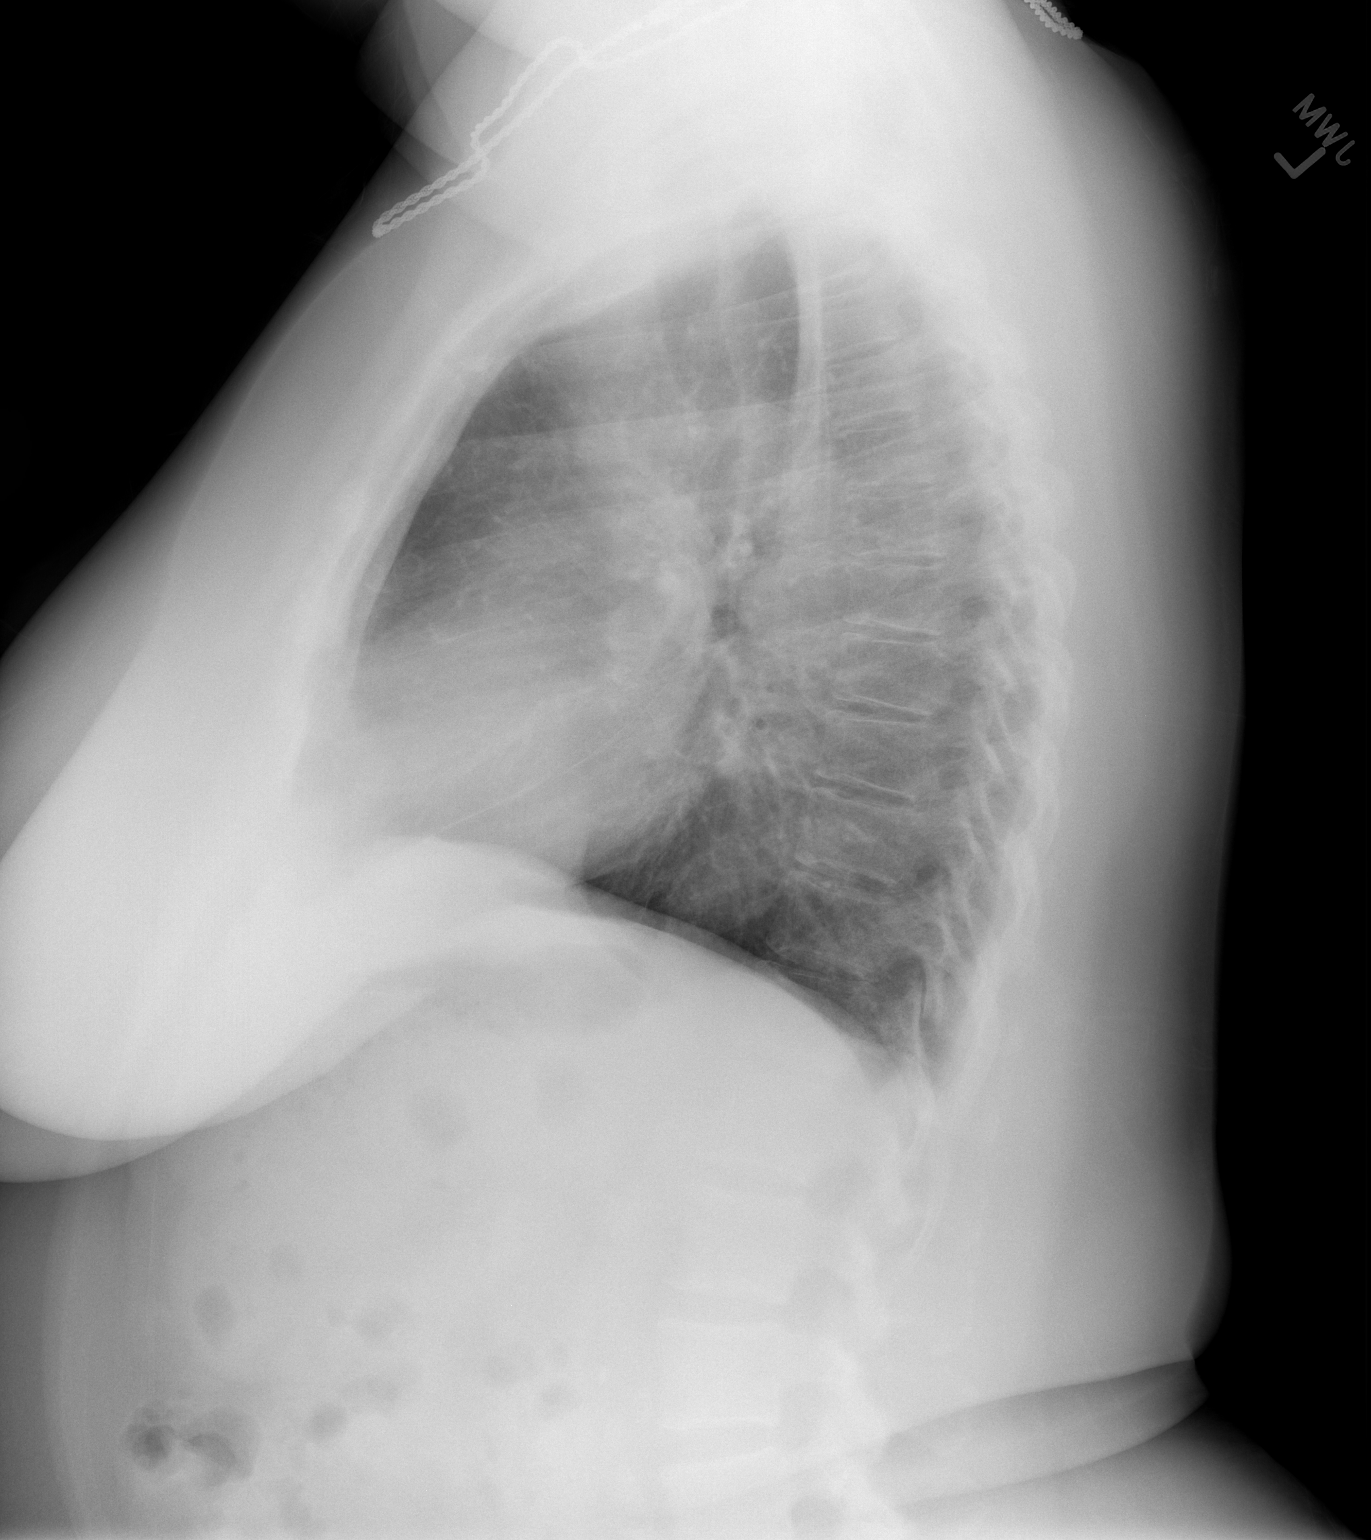

[2 of 2 positions shown; findings below may reference images not displayed]

FINDINGS: The heart size and mediastinal contours are within normal limits.
Both lungs are clear. The visualized skeletal structures are
unremarkable.
IMPRESSION: No active cardiopulmonary disease.

## 2017-06-03 ENCOUNTER — Encounter (HOSPITAL_BASED_OUTPATIENT_CLINIC_OR_DEPARTMENT_OTHER): Payer: Self-pay | Admitting: Emergency Medicine

## 2017-06-03 ENCOUNTER — Other Ambulatory Visit: Payer: Self-pay

## 2017-06-03 ENCOUNTER — Emergency Department (HOSPITAL_BASED_OUTPATIENT_CLINIC_OR_DEPARTMENT_OTHER)
Admission: EM | Admit: 2017-06-03 | Discharge: 2017-06-03 | Disposition: A | Discharge status: Home / Self Care | Payer: Medicare Other | Attending: Emergency Medicine | Admitting: Emergency Medicine

## 2017-06-03 DIAGNOSIS — H1032 Unspecified acute conjunctivitis, left eye: Secondary | ICD-10-CM | POA: Diagnosis not present

## 2017-06-03 DIAGNOSIS — E119 Type 2 diabetes mellitus without complications: Secondary | ICD-10-CM | POA: Insufficient documentation

## 2017-06-03 DIAGNOSIS — Z7984 Long term (current) use of oral hypoglycemic drugs: Secondary | ICD-10-CM | POA: Insufficient documentation

## 2017-06-03 DIAGNOSIS — F17210 Nicotine dependence, cigarettes, uncomplicated: Secondary | ICD-10-CM | POA: Diagnosis not present

## 2017-06-03 DIAGNOSIS — Z951 Presence of aortocoronary bypass graft: Secondary | ICD-10-CM | POA: Insufficient documentation

## 2017-06-03 DIAGNOSIS — I252 Old myocardial infarction: Secondary | ICD-10-CM | POA: Insufficient documentation

## 2017-06-03 DIAGNOSIS — H538 Other visual disturbances: Secondary | ICD-10-CM | POA: Diagnosis not present

## 2017-06-03 DIAGNOSIS — Z79899 Other long term (current) drug therapy: Secondary | ICD-10-CM | POA: Insufficient documentation

## 2017-06-03 DIAGNOSIS — Z7902 Long term (current) use of antithrombotics/antiplatelets: Secondary | ICD-10-CM | POA: Diagnosis not present

## 2017-06-03 DIAGNOSIS — Z955 Presence of coronary angioplasty implant and graft: Secondary | ICD-10-CM | POA: Diagnosis not present

## 2017-06-03 DIAGNOSIS — H5789 Other specified disorders of eye and adnexa: Secondary | ICD-10-CM | POA: Diagnosis present

## 2017-06-03 DIAGNOSIS — I10 Essential (primary) hypertension: Secondary | ICD-10-CM | POA: Insufficient documentation

## 2017-06-03 MED ORDER — TETRACAINE HCL 0.5 % OP SOLN
OPHTHALMIC | Status: AC
  Filled 2017-06-03: qty 4

## 2017-06-03 MED ORDER — FLUORESCEIN SODIUM 1 MG OP STRP
1.0000 | ORAL_STRIP | Freq: Once | OPHTHALMIC | Status: AC
  Administered 2017-06-03: 1 via OPHTHALMIC

## 2017-06-03 MED ORDER — FLUORESCEIN SODIUM 0.6 MG OP STRP
ORAL_STRIP | OPHTHALMIC | Status: AC
  Filled 2017-06-03: qty 2

## 2017-06-03 MED ORDER — ERYTHROMYCIN 5 MG/GM OP OINT
TOPICAL_OINTMENT | Freq: Once | OPHTHALMIC | Status: AC
  Administered 2017-06-03: 1 via OPHTHALMIC
  Filled 2017-06-03: qty 3.5

## 2017-06-03 MED ORDER — TETRACAINE HCL 0.5 % OP SOLN
2.0000 [drp] | Freq: Once | OPHTHALMIC | Status: AC
Start: 2017-06-03 — End: 2017-06-03
  Administered 2017-06-03: 2 [drp] via OPHTHALMIC

## 2017-06-03 NOTE — Discharge Instructions (Signed)
Apply erythromycin ointment every 4 hours while awake for 7 days.  Please follow-up with ophthalmology in 2 days if not improving.  Return if worsening symptoms.

## 2017-06-03 NOTE — ED Provider Notes (Signed)
[facility] EMERGENCY DEPARTMENT Provider Note   CSN: [id number] Arrival date & time: 06/03/17  1459     History   Chief Complaint Chief Complaint  Patient presents with  . Eye Problem    HPI Nicole Solis is a 56 y.o. female.  HPI Nicole Solis is a 56 y.o. female with history of hypertension, diabetes, MI, presents to emergency department complaining of pain in the drainage from the left eye.  Patient states her symptoms started several days ago.  She states that she has been putting Vaseline on her eye, and washing it with water.  She reports purulent drainage.  She states she has been around her cousin at the hospice who recently passed away infection.  She denies any nasal congestion.  No sore throat.  No fever.  Reports mild blurred vision in the eye but thinks it is from the drainage.  Denies any pain with extraocular movements.  Denies any pain with palpation of the eye.  She states that she feels like her right eye is getting red and painful as well. No drainage yet. Does not wear contacts. No injuries  Past Medical History:  Diagnosis Date  . Diabetes mellitus without complication (HCC)   . Hypertension   . MI    x 3  . Myocardial infarct (HCC)     There are no active problems to display for this patient.   Past Surgical History:  Procedure Laterality Date  . cardiac stents     x 3  . CESAREAN SECTION     x 4  . CORONARY ARTERY BYPASS GRAFT      OB History    No data available       Home Medications    Prior to Admission medications   Medication Sig Start Date End Date Taking? Authorizing Provider  OXYCODONE HCL PO Take by mouth.   Yes [provider]  aspirin 81 MG tablet Take 81 mg by mouth daily.    [provider]  atorvastatin (LIPITOR) 80 MG tablet Take 80 mg by mouth daily.    [provider]  azithromycin (ZITHROMAX Z-PAK) 250 MG tablet 2 po day one, then 1 daily x 4 days 04/15/14   Charlynne PanderYao, David Hsienta,  MD  benzonatate (TESSALON) 100 MG capsule Take 1 capsule (100 mg total) by mouth 3 (three) times daily as needed for cough. 04/15/14   Charlynne PanderYao, David Hsienta, MD  clindamycin (CLEOCIN) 150 MG capsule Take 1 capsule (150 mg total) by mouth 3 (three) times daily. 07/11/14   Molpus, John, MD  clopidogrel (PLAVIX) 75 MG tablet Take 75 mg by mouth daily.    [provider]  gabapentin (NEURONTIN) 100 MG capsule Take 100 mg by mouth 3 (three) times daily.    [provider]  HYDROcodone-acetaminophen (NORCO) 5-325 MG per tablet Take 1 tablet by mouth every 6 (six) hours as needed for moderate pain. 09/04/14   Nekeisha Aure, PA-C  lisinopril (PRINIVIL,ZESTRIL) 2.5 MG tablet Take 2.5 mg by mouth daily.    [provider]  metFORMIN (GLUCOPHAGE) 500 MG tablet Take by mouth 2 (two) times daily with a meal.    [provider]  metoprolol (LOPRESSOR) 50 MG tablet Take 50 mg by mouth 2 (two) times daily.    [provider]  nitroGLYCERIN (NITROSTAT) 0.4 MG SL tablet Place 0.4 mg under the tongue every 5 (five) minutes as needed for chest pain.    [provider]  predniSONE (DELTASONE) 20  MG tablet Take 40 mg daily x 2 days then 20 mg daily x 2 days 04/15/14   Charlynne PanderYao, David Hsienta, MD  sertraline (ZOLOFT) 50 MG tablet Take 50 mg by mouth daily.    [provider]    Family History History reviewed. No pertinent family history.  Social History Social History   Tobacco Use  . Smoking status: Current Every Day Smoker    Packs/day: 0.25    Types: Cigarettes  . Smokeless tobacco: Never Used  Substance Use Topics  . Alcohol use: No  . Drug use: No     Allergies   Penicillins   Review of Systems Review of Systems  Constitutional: Negative for chills and fever.  Eyes: Positive for pain, discharge, redness and itching. Negative for photophobia and visual disturbance.  Respiratory: Negative for cough, chest tightness and shortness of  breath.   Cardiovascular: Negative for chest pain, palpitations and leg swelling.  Gastrointestinal: Negative for abdominal pain, diarrhea, nausea and vomiting.  Musculoskeletal: Negative for arthralgias, myalgias, neck pain and neck stiffness.  Skin: Negative for rash.  Neurological: Negative for dizziness, weakness and headaches.  All other systems reviewed and are negative.    Physical Exam Updated Vital Signs BP 119/79 (BP Location: Right Arm)   Pulse 67   Temp 98.7 F (37.1 C) (Oral)   Resp 18   Ht 5' (1.524 m)   Wt 73.9 kg (163 lb)   SpO2 95%   BMI 31.83 kg/m   Physical Exam  Constitutional: She appears well-developed and well-nourished. No distress.  Eyes: Conjunctivae are normal.  Mild conjunctival injection on the left eye with purulent drainage.  Pupils are equal, round and reactive to light and accommodation bilaterally.  Direct photophobia in the left eye, no consensual photophobia present.  Normal extraocular movements with no pain.  Neck: Neck supple.  Cardiovascular: Normal rate, regular rhythm and normal heart sounds.  Pulmonary/Chest: Effort normal and breath sounds normal.  Neurological: She is alert.  Skin: Skin is warm and dry.  Nursing note and vitals reviewed.    ED Treatments / Results  Labs (all labs ordered are listed, but only abnormal results are displayed) Labs Reviewed - No data to display  EKG  EKG Interpretation None       Radiology No results found.  Procedures Procedures (including critical care time)  Medications Ordered in ED Medications  tetracaine (PONTOCAINE) 0.5 % ophthalmic solution 2 drop (2 drops Left Eye Given 06/03/17 1550)  fluorescein ophthalmic strip 1 strip (1 strip Left Eye Given 06/03/17 1550)  erythromycin ophthalmic ointment (1 application Both Eyes Given 06/03/17 1554)     Initial Impression / Assessment and Plan / ED Course  I have reviewed the triage vital signs and the nursing notes.  Pertinent  labs & imaging results that were available during my care of the patient were reviewed by me and considered in my medical decision making (see chart for details).     Patient with bacterial conjunctivitis in the left eye, thinks it spreading to the right.  Visual acuity is right eye 20/30, left 20/40, fluorescein stain is negative.  I do not see any ulcers, abrasions, dendritic lesions in the left eye.  Patient has purulent drainage from the eye.  Will treat with erythromycin ointment.  Close follow-up with ophthalmology.  Patient has an ophthalmologist but given no one on call just in case she is unable to get in quickly.  Return precautions discussed.  Vitals:   06/03/17 1508  06/03/17 1510 06/03/17 1631  BP:  119/79 139/82  Pulse:  67 72  Resp:  18 16  Temp:  98.7 F (37.1 C)   TempSrc:  Oral   SpO2:  95% 98%  Weight: 73.9 kg (163 lb)    Height: 5' (1.524 m)       Final Clinical Impressions(s) / ED Diagnoses   Final diagnoses:  Acute bacterial conjunctivitis of left eye    ED Discharge Orders    None       Nicole Crumble, PA-C 06/03/17 1637    Ward, Layla Maw, DO 06/04/17 [phone]

## 2017-06-03 NOTE — ED Triage Notes (Signed)
Reports bilateral eye drainage and pain since Friday.

## 2017-06-03 NOTE — ED Notes (Signed)
ED Provider at bedside. 

## 2017-06-23 ENCOUNTER — Encounter (HOSPITAL_BASED_OUTPATIENT_CLINIC_OR_DEPARTMENT_OTHER): Payer: Self-pay | Admitting: Emergency Medicine

## 2017-06-23 ENCOUNTER — Other Ambulatory Visit: Payer: Self-pay

## 2017-06-23 ENCOUNTER — Emergency Department (HOSPITAL_BASED_OUTPATIENT_CLINIC_OR_DEPARTMENT_OTHER)
Admission: EM | Admit: 2017-06-23 | Discharge: 2017-06-23 | Disposition: A | Discharge status: Nursing Home (Includes ICF) | Payer: Medicare Other | Attending: Emergency Medicine | Admitting: Emergency Medicine

## 2017-06-23 ENCOUNTER — Emergency Department (HOSPITAL_BASED_OUTPATIENT_CLINIC_OR_DEPARTMENT_OTHER): Payer: Medicare Other

## 2017-06-23 DIAGNOSIS — E119 Type 2 diabetes mellitus without complications: Secondary | ICD-10-CM | POA: Diagnosis not present

## 2017-06-23 DIAGNOSIS — Z79899 Other long term (current) drug therapy: Secondary | ICD-10-CM | POA: Insufficient documentation

## 2017-06-23 DIAGNOSIS — I10 Essential (primary) hypertension: Secondary | ICD-10-CM | POA: Insufficient documentation

## 2017-06-23 DIAGNOSIS — Z951 Presence of aortocoronary bypass graft: Secondary | ICD-10-CM | POA: Diagnosis not present

## 2017-06-23 DIAGNOSIS — I214 Non-ST elevation (NSTEMI) myocardial infarction: Secondary | ICD-10-CM

## 2017-06-23 DIAGNOSIS — F17210 Nicotine dependence, cigarettes, uncomplicated: Secondary | ICD-10-CM | POA: Diagnosis not present

## 2017-06-23 DIAGNOSIS — R0789 Other chest pain: Secondary | ICD-10-CM | POA: Diagnosis present

## 2017-06-23 LAB — COMPREHENSIVE METABOLIC PANEL
ALT: 20 U/L (ref 14–54)
ANION GAP: 8 (ref 5–15)
AST: 26 U/L (ref 15–41)
Albumin: 4 g/dL (ref 3.5–5.0)
Alkaline Phosphatase: 92 U/L (ref 38–126)
BUN: 13 mg/dL (ref 6–20)
CO2: 27 mmol/L (ref 22–32)
Calcium: 9.1 mg/dL (ref 8.9–10.3)
Chloride: 106 mmol/L (ref 101–111)
Creatinine, Ser: 0.76 mg/dL (ref 0.44–1.00)
Glucose, Bld: 108 mg/dL — ABNORMAL HIGH (ref 65–99)
POTASSIUM: 4.2 mmol/L (ref 3.5–5.1)
SODIUM: 141 mmol/L (ref 135–145)
Total Bilirubin: 0.9 mg/dL (ref 0.3–1.2)
Total Protein: 7.6 g/dL (ref 6.5–8.1)

## 2017-06-23 LAB — CBC WITH DIFFERENTIAL/PLATELET
BASOS PCT: 0 %
Basophils Absolute: 0 10*3/uL (ref 0.0–0.1)
Eosinophils Absolute: 0.1 10*3/uL (ref 0.0–0.7)
Eosinophils Relative: 1 %
HEMATOCRIT: 44 % (ref 36.0–46.0)
Hemoglobin: 14.6 g/dL (ref 12.0–15.0)
Lymphocytes Relative: 50 %
Lymphs Abs: 4.4 10*3/uL — ABNORMAL HIGH (ref 0.7–4.0)
MCH: 30.7 pg (ref 26.0–34.0)
MCHC: 33.2 g/dL (ref 30.0–36.0)
MCV: 92.6 fL (ref 78.0–100.0)
MONOS PCT: 8 %
Monocytes Absolute: 0.7 10*3/uL (ref 0.1–1.0)
NEUTROS ABS: 3.5 10*3/uL (ref 1.7–7.7)
Neutrophils Relative %: 41 %
Platelets: 255 10*3/uL (ref 150–400)
RBC: 4.75 MIL/uL (ref 3.87–5.11)
RDW: 14.1 % (ref 11.5–15.5)
WBC: 8.7 10*3/uL (ref 4.0–10.5)

## 2017-06-23 LAB — TROPONIN I
TROPONIN I: 0.47 ng/mL — AB (ref <0.03)
Troponin I: 0.68 ng/mL (ref <0.03)

## 2017-06-23 MED ORDER — HEPARIN BOLUS VIA INFUSION
3000.0000 [IU] | Freq: Once | INTRAVENOUS | Status: AC
  Administered 2017-06-23: 3000 [IU] via INTRAVENOUS

## 2017-06-23 MED ORDER — NITROGLYCERIN 2 % TD OINT
1.0000 [in_us] | TOPICAL_OINTMENT | Freq: Once | TRANSDERMAL | Status: AC
  Administered 2017-06-23: 1 [in_us] via TOPICAL
  Filled 2017-06-23: qty 1

## 2017-06-23 MED ORDER — MORPHINE SULFATE (PF) 4 MG/ML IV SOLN
4.0000 mg | Freq: Once | INTRAVENOUS | Status: AC
Start: 2017-06-23 — End: 2017-06-23
  Administered 2017-06-23: 4 mg via INTRAVENOUS
  Filled 2017-06-23: qty 1

## 2017-06-23 MED ORDER — HEPARIN (PORCINE) IN NACL 100-0.45 UNIT/ML-% IJ SOLN
800.0000 [IU]/h | INTRAMUSCULAR | Status: DC
Start: 2017-06-23 — End: 2017-06-23
  Administered 2017-06-23: 800 [IU]/h via INTRAVENOUS
  Filled 2017-06-23: qty 250

## 2017-06-23 MED ORDER — MORPHINE SULFATE (PF) 2 MG/ML IV SOLN
2.0000 mg | Freq: Once | INTRAVENOUS | Status: AC
  Administered 2017-06-23: 2 mg via INTRAVENOUS
  Filled 2017-06-23: qty 1

## 2017-06-23 NOTE — ED Provider Notes (Signed)
Emergency Department Provider Note   I have reviewed the triage vital signs and the nursing notes.   HISTORY  Chief Complaint Chest Pain   HPI Nicole Solis is a 56 y.o. female with PMH of CAD s/p PCI with stents, DM, and HTN presents to the emergency department for evaluation of chest pain starting at 2 AM this morning.  Patient states she has a central chest tightness with left arm numbness.  She states that she has had similar pain with heart attacks in the past.  She is been taking nitroglycerin at home every 15 minutes for pain but denies any relief with this medication.  Went back to sleep and tried to ignore it but when she woke up it remained and so she decided to present to the emergency department.  She follows with a local cardiologist, Dr. Dot Beenohrbeck.    Past Medical History:  Diagnosis Date  . Diabetes mellitus without complication (HCC)   . Hypertension   . MI    x 3  . Myocardial infarct (HCC)     There are no active problems to display for this patient.   Past Surgical History:  Procedure Laterality Date  . cardiac stents     x 3  . CESAREAN SECTION     x 4  . CORONARY ARTERY BYPASS GRAFT      Current Outpatient Rx  . Order #: [id number] Class: Historical Med  . Order #: [id number] Class: Historical Med  . Order #: [id number] Class: Print  . Order #: [id number] Class: Print  . Order #: [id number] Class: Print  . Order #: [id number] Class: Historical Med  . Order #: [id number] Class: Historical Med  . Order #: [id number] Class: Print  . Order #: [id number] Class: Historical Med  . Order #: [id number] Class: Historical Med  . Order #: [id number] Class: Historical Med  . Order #: [id number] Class: Historical Med  . Order #: [id number] Class: Historical Med  . Order #: [id number] Class: Print  . Order #: [id number] Class: Historical Med    Allergies Patient has no active allergies.  No family history on file.  Social History Social History   Tobacco Use  . Smoking  status: Current Every Day Smoker    Packs/day: 0.25    Types: Cigarettes  . Smokeless tobacco: Never Used  Substance Use Topics  . Alcohol use: No  . Drug use: No    Review of Systems  Constitutional: No fever/chills Eyes: No visual changes. ENT: No sore throat. Cardiovascular: Positive chest pain. Respiratory: Denies shortness of breath. Gastrointestinal: No abdominal pain.  No nausea, no vomiting.  No diarrhea.  No constipation. Genitourinary: Negative for dysuria. Musculoskeletal: Negative for back pain. Skin: Negative for rash. Neurological: Negative for headaches, focal weakness. Positive left arm numbness.   10-point ROS otherwise negative.  ____________________________________________   PHYSICAL EXAM:  VITAL SIGNS: ED Triage Vitals  Enc Vitals Group     BP 06/23/17 1209 (!) 158/90     Pulse Rate 06/23/17 1209 (!) 58     Resp 06/23/17 1209 16     Temp 06/23/17 1209 98.5 F (36.9 C)     Temp Source 06/23/17 1209 Oral     SpO2 06/23/17 1209 100 %     Weight 06/23/17 1210 163 lb (73.9 kg)     Height 06/23/17 1210 5' (1.524 m)     Pain Score 06/23/17 1208 8    Constitutional: Alert and oriented. Tearful on arrival but consolable.  Eyes: Conjunctivae are normal. Head: Atraumatic. Nose: No congestion/rhinnorhea. Mouth/Throat: Mucous membranes are  moist. Neck: No stridor.   Cardiovascular: Normal rate, regular rhythm. Good peripheral circulation. Grossly normal heart sounds.   Respiratory: Normal respiratory effort.  No retractions. Lungs CTAB. Gastrointestinal: Soft and nontender. No distention.  Musculoskeletal: No lower extremity tenderness nor edema. No gross deformities of extremities. Neurologic:  Normal speech and language. No gross focal neurologic deficits are appreciated.  Skin:  Skin is warm, dry and intact. No rash noted.  ____________________________________________   LABS (all labs ordered are listed, but only abnormal results are  displayed)  Labs Reviewed  COMPREHENSIVE METABOLIC PANEL - Abnormal; Notable for the following components:      Result Value   Glucose, Bld 108 (*)    All other components within normal limits  CBC WITH DIFFERENTIAL/PLATELET - Abnormal; Notable for the following components:   Lymphs Abs 4.4 (*)    All other components within normal limits  TROPONIN I - Abnormal; Notable for the following components:   Troponin I 0.47 (*)    All other components within normal limits  HEPARIN LEVEL (UNFRACTIONATED)  TROPONIN I   ____________________________________________  EKG   EKG Interpretation  Date/Time:  Sunday June 23 2017 12:05:56 EST Ventricular Rate:  62 PR Interval:    QRS Duration: 92 QT Interval:  410 QTC Calculation: 417 R Axis:   43 Text Interpretation:  Sinus rhythm No STEMI.  Confirmed by Alona Bene [phone]) on 06/23/2017 12:09:31 PM       ____________________________________________  RADIOLOGY  Dg Chest 2 View  Result Date: 06/23/2017 CLINICAL DATA:  Chest pain since last evening. EXAM: CHEST  2 VIEW COMPARISON:  Chest x-ray 10/13/2014 FINDINGS: The cardiac silhouette, mediastinal and hilar contours are within normal limits and stable. The lungs are clear. No pleural effusion. The bony thorax is intact. IMPRESSION: No acute cardiopulmonary findings. Electronically Signed   By: Rudie Meyer M.D.   On: 06/23/2017 12:29    ____________________________________________   PROCEDURES  Procedure(s) performed:   .Critical Care Performed by: Maia Plan, MD Authorized by: Maia Plan, MD   Critical care provider statement:    Critical care time (minutes):  45   Critical care time was exclusive of:  Separately billable procedures and treating other patients   Critical care was necessary to treat or prevent imminent or life-threatening deterioration of the following conditions:  Circulatory failure and cardiac failure   Critical care was time spent  personally by me on the following activities:  Ordering and performing treatments and interventions, ordering and review of laboratory studies, ordering and review of radiographic studies, pulse oximetry, re-evaluation of patient's condition, review of old charts, obtaining history from patient or surrogate, examination of patient, evaluation of patient's response to treatment, discussions with consultants and development of treatment plan with patient or surrogate   I assumed direction of critical care for this patient from another provider in my specialty: no     ____________________________________________   INITIAL IMPRESSION / ASSESSMENT AND PLAN / ED COURSE  Pertinent labs & imaging results that were available during my care of the patient were reviewed by me and considered in my medical decision making (see chart for details).  Patient appears uncomfortable and is tearful on arrival.  She is actively having chest pain and complaining of some left arm numbness typical of her prior heart attack symptoms.  She has history of PCI and stenting.  Main cardiologist practices out of [facility].  Patient has taken nitroglycerin with no relief in symptoms.  Plan  for morphine here along with chest x-ray, labs, and reassess.  She has equal pulses in the upper and lower extremities.  She states her pain is typical of her ACS symptoms.  Much lower suspicion clinically for aortic dissection or pulmonary embolism although I have carefully considered these entities.   01:10 PM  reevaluated patient after troponin came back at 0.47.  She continues to have chest discomfort.  Following after treatment with morphine.  Patient also given a nitroglycerin ointment.  Repeat EKG at this time shows no acute ischemia or change from her arrival EKG.  I am starting heparin and will consult the cardiology group at [facility] where the patient is followed by that cardiology service as an  outpatient. No STEMI.   01:30 PM Spoke with Dr. Minda Meoory Sessoms with [facility]. He will accept the patient in transfer and will consult Cardiology to discuss urgent evaluation on arrival. I have already ordered heparin and will continue to try and better control chest pain. Will repeat troponin at 2 PM.    I reviewed all nursing notes, vitals, pertinent old records, EKGs, labs, imaging (as available).  ____________________________________________  FINAL CLINICAL IMPRESSION(S) / ED DIAGNOSES  Final diagnoses:  NSTEMI (non-ST elevated myocardial infarction) (HCC)     MEDICATIONS GIVEN DURING THIS VISIT:  Medications  heparin bolus via infusion 3,000 Units (not administered)  heparin ADULT infusion 100 units/mL (25000 units/24550mL sodium chloride 0.45%) (not administered)  morphine 2 MG/ML injection 2 mg (not administered)  morphine 4 MG/ML injection 4 mg (4 mg Intravenous Given 06/23/17 1251)  nitroGLYCERIN (NITROGLYN) 2 % ointment 1 inch (1 inch Topical Given 06/23/17 1251)    Note:  This document was prepared using Dragon voice recognition software and may include unintentional dictation errors.  Alona BeneJoshua Raiyan Dalesandro, MD Emergency Medicine    Rahn Lacuesta, Arlyss RepressJoshua G, MD 06/23/17 [phone]

## 2017-06-23 NOTE — Progress Notes (Signed)
ANTICOAGULATION CONSULT NOTE - Initial Consult  Pharmacy Consult:  Heparin Indication:  NSTEMI  No Active Allergies  Patient Measurements: Height: 5' (152.4 cm) Weight: 163 lb (73.9 kg) IBW/kg (Calculated) : 45.5 Heparin Dosing Weight: 66 kg  Vital Signs: Temp: 98.5 F (36.9 C) (12/30 1209) Temp Source: Oral (12/30 1209) BP: 133/68 (12/30 1323) Pulse Rate: 57 (12/30 1323)  Labs: Recent Labs    06/23/17 1210  HGB 14.6  HCT 44.0  PLT 255  CREATININE 0.76  TROPONINI 0.47*    Estimated Creatinine Clearance: 69.7 mL/min (by C-G formula based on SCr of 0.76 mg/dL).   Medical History: Past Medical History:  Diagnosis Date  . Diabetes mellitus without complication (HCC)   . Hypertension   . MI    x 3  . Myocardial infarct [facility](HCC)       Assessment: [age] YOF presented with chest pain and elevated troponin.  Pharmacy consulted to initiate IV heparin for NSTEMI.  Baseline labs reviewed.   Goal of Therapy:  Heparin level 0.3-0.7 units/ml Monitor platelets by anticoagulation protocol: Yes    Plan:  Heparin 3000 units IV bolus x 1, then Heparin gtt at 800 units/hr Check 6 hr heparin level Daily heparin level and CBC   Yechiel Erny D. Laney Potashang, PharmD, BCPS Pager:  [phone] 06/23/2017, 1:35 PM

## 2017-06-23 NOTE — ED Triage Notes (Signed)
Pt c/o mid CP since last pm; feels like something stuck; took NTG PTA w/o relief

## 2017-06-23 NOTE — ED Notes (Signed)
Troponin 0.47, given to ED MD and primary MD

## 2018-01-11 ENCOUNTER — Other Ambulatory Visit: Payer: Self-pay

## 2018-01-11 ENCOUNTER — Encounter (HOSPITAL_BASED_OUTPATIENT_CLINIC_OR_DEPARTMENT_OTHER): Payer: Self-pay | Admitting: Emergency Medicine

## 2018-01-11 ENCOUNTER — Emergency Department (HOSPITAL_BASED_OUTPATIENT_CLINIC_OR_DEPARTMENT_OTHER)
Admission: EM | Admit: 2018-01-11 | Discharge: 2018-01-11 | Disposition: A | Discharge status: Home / Self Care | Payer: Medicare Other | Attending: Emergency Medicine | Admitting: Emergency Medicine

## 2018-01-11 DIAGNOSIS — R451 Restlessness and agitation: Secondary | ICD-10-CM | POA: Insufficient documentation

## 2018-01-11 DIAGNOSIS — M79606 Pain in leg, unspecified: Secondary | ICD-10-CM | POA: Diagnosis not present

## 2018-01-11 DIAGNOSIS — I252 Old myocardial infarction: Secondary | ICD-10-CM | POA: Insufficient documentation

## 2018-01-11 DIAGNOSIS — F17210 Nicotine dependence, cigarettes, uncomplicated: Secondary | ICD-10-CM | POA: Diagnosis not present

## 2018-01-11 DIAGNOSIS — Z79899 Other long term (current) drug therapy: Secondary | ICD-10-CM | POA: Insufficient documentation

## 2018-01-11 DIAGNOSIS — Z7984 Long term (current) use of oral hypoglycemic drugs: Secondary | ICD-10-CM | POA: Insufficient documentation

## 2018-01-11 DIAGNOSIS — E119 Type 2 diabetes mellitus without complications: Secondary | ICD-10-CM | POA: Insufficient documentation

## 2018-01-11 DIAGNOSIS — Z951 Presence of aortocoronary bypass graft: Secondary | ICD-10-CM | POA: Insufficient documentation

## 2018-01-11 DIAGNOSIS — I10 Essential (primary) hypertension: Secondary | ICD-10-CM | POA: Diagnosis not present

## 2018-01-11 DIAGNOSIS — Z7982 Long term (current) use of aspirin: Secondary | ICD-10-CM | POA: Diagnosis not present

## 2018-01-11 DIAGNOSIS — Z7902 Long term (current) use of antithrombotics/antiplatelets: Secondary | ICD-10-CM | POA: Diagnosis not present

## 2018-01-11 DIAGNOSIS — G4700 Insomnia, unspecified: Secondary | ICD-10-CM | POA: Insufficient documentation

## 2018-01-11 DIAGNOSIS — R350 Frequency of micturition: Secondary | ICD-10-CM | POA: Diagnosis present

## 2018-01-11 DIAGNOSIS — R197 Diarrhea, unspecified: Secondary | ICD-10-CM | POA: Insufficient documentation

## 2018-01-11 LAB — CBC WITH DIFFERENTIAL/PLATELET
Basophils Absolute: 0 10*3/uL (ref 0.0–0.1)
Basophils Relative: 0 %
EOS PCT: 1 %
Eosinophils Absolute: 0.1 10*3/uL (ref 0.0–0.7)
HCT: 43.6 % (ref 36.0–46.0)
Hemoglobin: 14.8 g/dL (ref 12.0–15.0)
LYMPHS PCT: 42 %
Lymphs Abs: 4.9 10*3/uL — ABNORMAL HIGH (ref 0.7–4.0)
MCH: 31.2 pg (ref 26.0–34.0)
MCHC: 33.9 g/dL (ref 30.0–36.0)
MCV: 92 fL (ref 78.0–100.0)
Monocytes Absolute: 0.9 10*3/uL (ref 0.1–1.0)
Monocytes Relative: 8 %
Neutro Abs: 5.9 10*3/uL (ref 1.7–7.7)
Neutrophils Relative %: 49 %
PLATELETS: 268 10*3/uL (ref 150–400)
RBC: 4.74 MIL/uL (ref 3.87–5.11)
RDW: 12.8 % (ref 11.5–15.5)
WBC: 11.8 10*3/uL — AB (ref 4.0–10.5)

## 2018-01-11 LAB — BASIC METABOLIC PANEL
Anion gap: 7 (ref 5–15)
BUN: 15 mg/dL (ref 6–20)
CALCIUM: 9.5 mg/dL (ref 8.9–10.3)
CO2: 25 mmol/L (ref 22–32)
Chloride: 107 mmol/L (ref 98–111)
Creatinine, Ser: 0.67 mg/dL (ref 0.44–1.00)
GFR calc non Af Amer: >60 mL/min (ref >60)
Glucose, Bld: 118 mg/dL — ABNORMAL HIGH (ref 70–99)
POTASSIUM: 4.3 mmol/L (ref 3.5–5.1)
SODIUM: 139 mmol/L (ref 135–145)

## 2018-01-11 LAB — URINALYSIS, ROUTINE W REFLEX MICROSCOPIC
Bilirubin Urine: NEGATIVE
Glucose, UA: NEGATIVE mg/dL
HGB URINE DIPSTICK: NEGATIVE
Ketones, ur: NEGATIVE mg/dL
LEUKOCYTES UA: NEGATIVE
Nitrite: NEGATIVE
PROTEIN: NEGATIVE mg/dL
Specific Gravity, Urine: <1.005 — ABNORMAL LOW (ref 1.005–1.030)
pH: 6 (ref 5.0–8.0)

## 2018-01-11 MED ORDER — LORAZEPAM 2 MG/ML IJ SOLN
1.0000 mg | Freq: Once | INTRAMUSCULAR | Status: AC
  Administered 2018-01-11: 1 mg via INTRAVENOUS
  Filled 2018-01-11: qty 1

## 2018-01-11 MED ORDER — LORAZEPAM 1 MG PO TABS: 1.0000 mg | ORAL_TABLET | Freq: Four times a day (QID) | ORAL | 0 refills | Status: AC | PRN

## 2018-01-11 NOTE — ED Triage Notes (Signed)
Patient states 2 episodes of diarrhea this evening around midnight; then states she had increased urinary frequency, co bilateral lower extremity pain; denies NV; ambulatory without difficulty; nad noted; patient states she feels restless.

## 2018-01-11 NOTE — ED Provider Notes (Signed)
[facility] EMERGENCY DEPARTMENT Provider Note   CSN: [id number] Arrival date & time: 01/11/18  0451     History   Chief Complaint Chief Complaint  Patient presents with  . Urinary Frequency    HPI Nicole Solis is a 57 y.o. female.  Patient is a 57 year old female with history of diabetes, hypertension, and cardiac stent.  She presents today for evaluation of urinary frequency, diarrhea, leg pain, and restlessness.  This started in the night.  She states that she cannot sleep.  Every time she tries to lay back, she jumps right back up and is wide-awake.  She says she "cannot understand why she cannot go to sleep".  She also reports urinary frequency but no fevers.  The history is provided by the patient.  Urinary Frequency  This is a new problem. The current episode started yesterday. The problem occurs constantly. The problem has been rapidly worsening. Pertinent negatives include no chest pain and no abdominal pain. Nothing aggravates the symptoms.    Past Medical History:  Diagnosis Date  . Diabetes mellitus without complication (HCC)   . Hypertension   . MI    x 3  . Myocardial infarct (HCC)     There are no active problems to display for this patient.   Past Surgical History:  Procedure Laterality Date  . cardiac stents     x 3  . CESAREAN SECTION     x 4  . CORONARY ARTERY BYPASS GRAFT       OB History   None      Home Medications    Prior to Admission medications   Medication Sig Start Date End Date Taking? Authorizing Provider  aspirin 81 MG tablet Take 81 mg by mouth daily.    [provider]  atorvastatin (LIPITOR) 80 MG tablet Take 80 mg by mouth daily.    [provider]  azithromycin (ZITHROMAX Z-PAK) 250 MG tablet 2 po day one, then 1 daily x 4 days 04/15/14   Charlynne PanderYao, David Hsienta, MD  benzonatate (TESSALON) 100 MG capsule Take 1 capsule (100 mg total) by mouth 3 (three) times daily as needed for cough. 04/15/14    Charlynne PanderYao, David Hsienta, MD  clindamycin (CLEOCIN) 150 MG capsule Take 1 capsule (150 mg total) by mouth 3 (three) times daily. 07/11/14   Molpus, John, MD  clopidogrel (PLAVIX) 75 MG tablet Take 75 mg by mouth daily.    [provider]  gabapentin (NEURONTIN) 100 MG capsule Take 100 mg by mouth 3 (three) times daily.    [provider]  HYDROcodone-acetaminophen (NORCO) 5-325 MG per tablet Take 1 tablet by mouth every 6 (six) hours as needed for moderate pain. 09/04/14   Kirichenko, Tatyana, PA-C  lisinopril (PRINIVIL,ZESTRIL) 2.5 MG tablet Take 2.5 mg by mouth daily.    [provider]  metFORMIN (GLUCOPHAGE) 500 MG tablet Take by mouth 2 (two) times daily with a meal.    [provider]  metoprolol (LOPRESSOR) 50 MG tablet Take 50 mg by mouth 2 (two) times daily.    [provider]  nitroGLYCERIN (NITROSTAT) 0.4 MG SL tablet Place 0.4 mg under the tongue every 5 (five) minutes as needed for chest pain.    [provider]  OXYCODONE HCL PO Take by mouth.    [provider]  predniSONE (DELTASONE) 20 MG tablet Take 40 mg daily x 2 days then 20 mg daily x 2 days 04/15/14   Charlynne PanderYao, David Hsienta, MD  sertraline (ZOLOFT) 50 MG tablet Take 50 mg by mouth daily.    [provider]    Family History No family history on file.  Social History Social History   Tobacco Use  . Smoking status: Current Every Day Smoker    Packs/day: 0.25    Types: Cigarettes  . Smokeless tobacco: Never Used  Substance Use Topics  . Alcohol use: No  . Drug use: No     Allergies   Patient has no known allergies.   Review of Systems Review of Systems  Cardiovascular: Negative for chest pain.  Gastrointestinal: Negative for abdominal pain.  Genitourinary: Positive for frequency.  All other systems reviewed and are negative.    Physical Exam Updated Vital Signs BP (!) 123/94 (BP Location: Left Arm)   Pulse (!) 58   Temp 97.8 F (36.6 C)  (Oral)   Resp 18   Ht 5' (1.524 m)   Wt 73.9 kg (163 lb)   SpO2 97%   BMI 31.83 kg/m   Physical Exam  Constitutional: She is oriented to person, place, and time. She appears well-developed and well-nourished. No distress.  HENT:  Head: Normocephalic and atraumatic.  Neck: Normal range of motion. Neck supple.  Cardiovascular: Normal rate and regular rhythm. Exam reveals no gallop and no friction rub.  No murmur heard. Pulmonary/Chest: Effort normal and breath sounds normal. No respiratory distress. She has no wheezes.  Abdominal: Soft. Bowel sounds are normal. She exhibits no distension. There is no tenderness.  Musculoskeletal: Normal range of motion.  Neurological: She is alert and oriented to person, place, and time.  Skin: Skin is warm and dry. She is not diaphoretic.  Nursing note and vitals reviewed.    ED Treatments / Results  Labs (all labs ordered are listed, but only abnormal results are displayed) Labs Reviewed  URINALYSIS, ROUTINE W REFLEX MICROSCOPIC  BASIC METABOLIC PANEL  CBC WITH DIFFERENTIAL/PLATELET    EKG None  Radiology No results found.  Procedures Procedures (including critical care time)  Medications Ordered in ED Medications - No data to display   Initial Impression / Assessment and Plan / ED Course  I have reviewed the triage vital signs and the nursing notes.  Pertinent labs & imaging results that were available during my care of the patient were reviewed by me and considered in my medical decision making (see chart for details).  Patient is a 57 year old female with history of chronic pain, anxiety, presenting here with complaints of restlessness and not being able to sleep.  She has been pacing in her exam room and moaning.  I am uncertain as to why she is experiencing the symptoms, however nothing appears emergent.  Her vitals are stable and work-up thus far is unremarkable.  She will be given Ativan as she appears very anxious and may  well be having a panic attack.  Final Clinical Impressions(s) / ED Diagnoses   Final diagnoses:  None    ED Discharge Orders    None       Geoffery Lyons, MD 01/11/18 [phone]

## 2018-01-11 NOTE — ED Notes (Signed)
ED Provider at bedside. 

## 2018-01-11 NOTE — ED Notes (Signed)
Patient denies any diarrhea episodes since arrival to ed this am.

## 2018-01-11 NOTE — Discharge Instructions (Addendum)
Ativan as prescribed as needed for anxiety. ° °Return to the emergency department if symptoms significantly worsen or change. °

## 2018-05-16 IMAGING — DX DG CHEST 2V
2 series · 2 of 2 positions shown · non-contrast
Comparison: Chest x-ray 10/13/2014

CLINICAL DATA: Chest pain since last evening.

EXAM:
CHEST  2 VIEW

[chest lat]
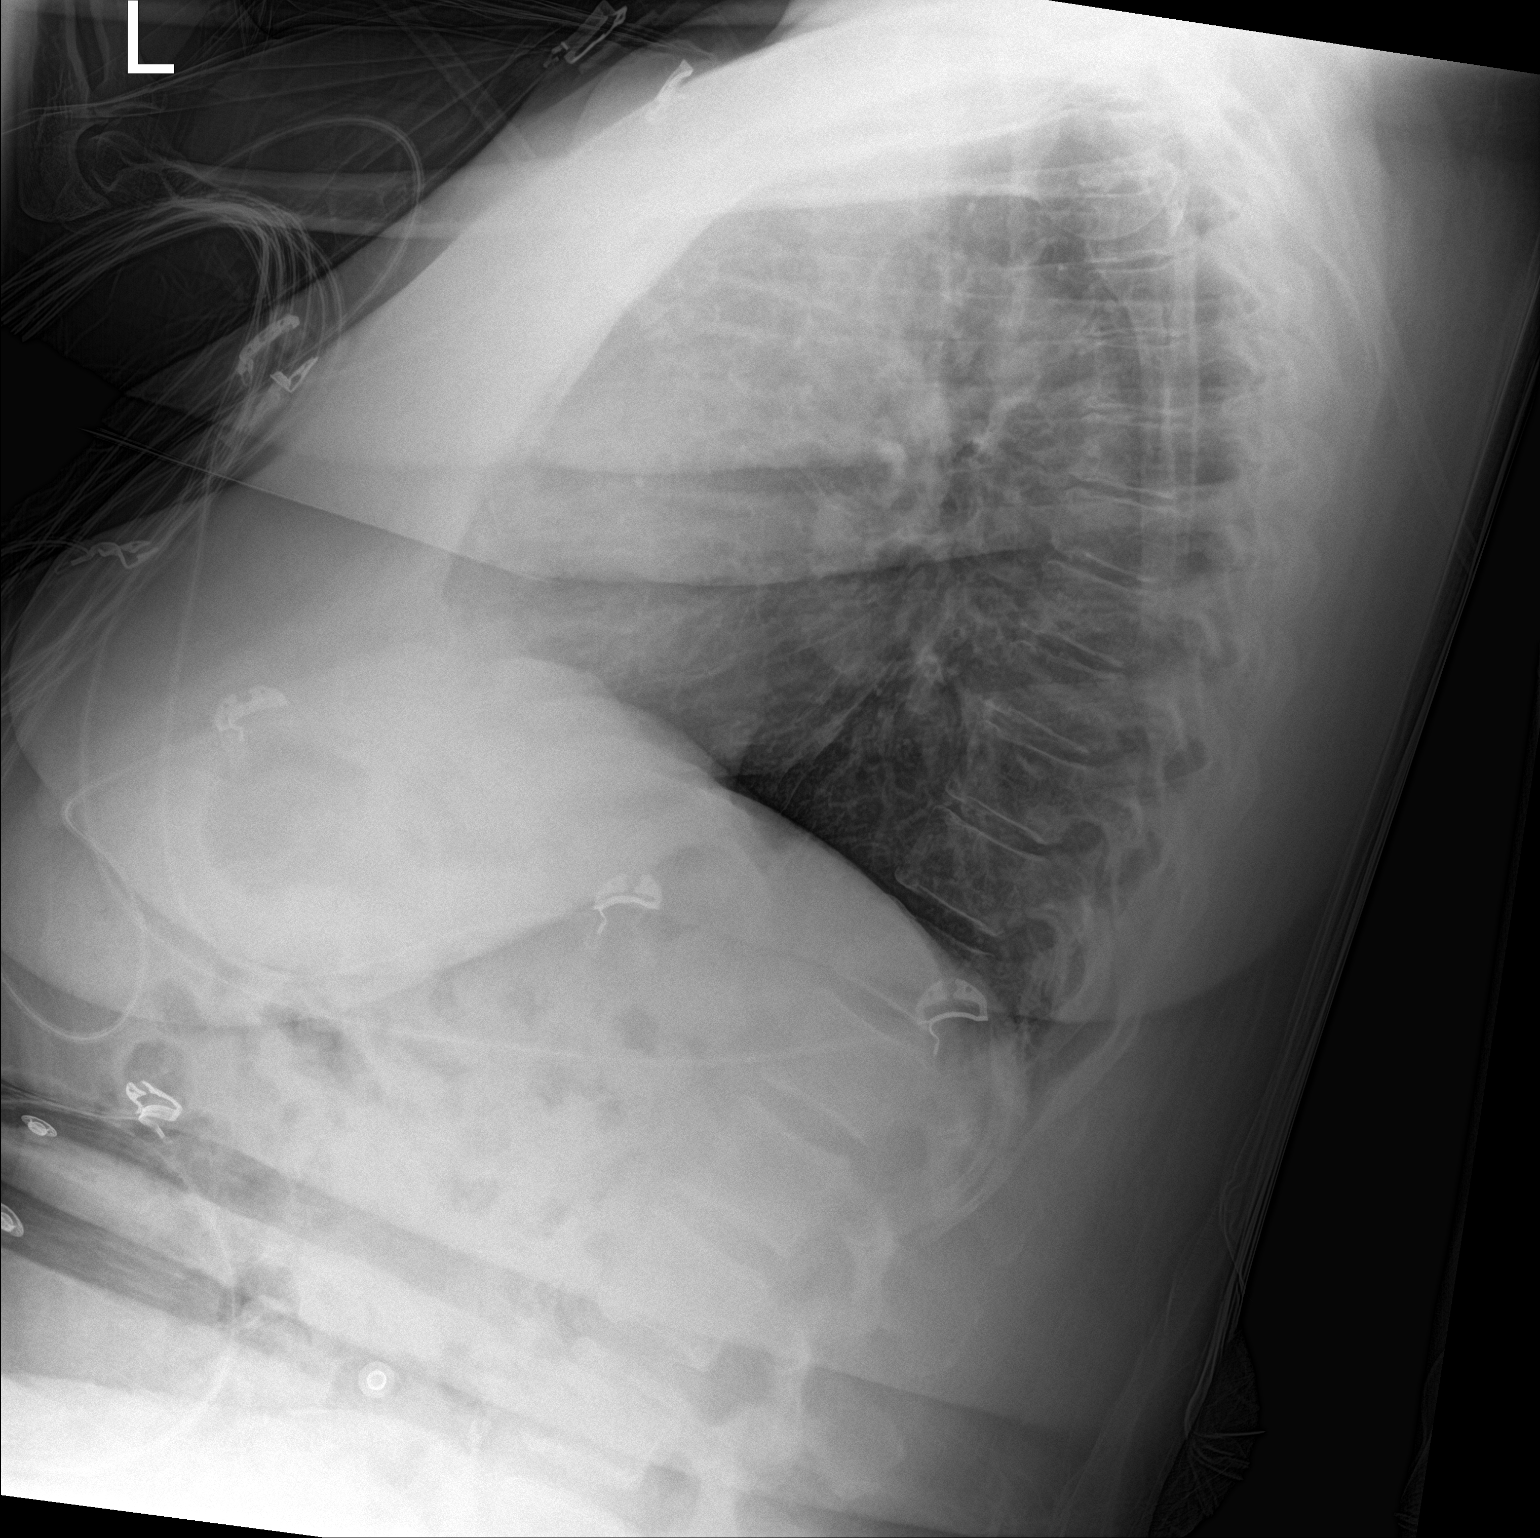

[chest ap strecther]
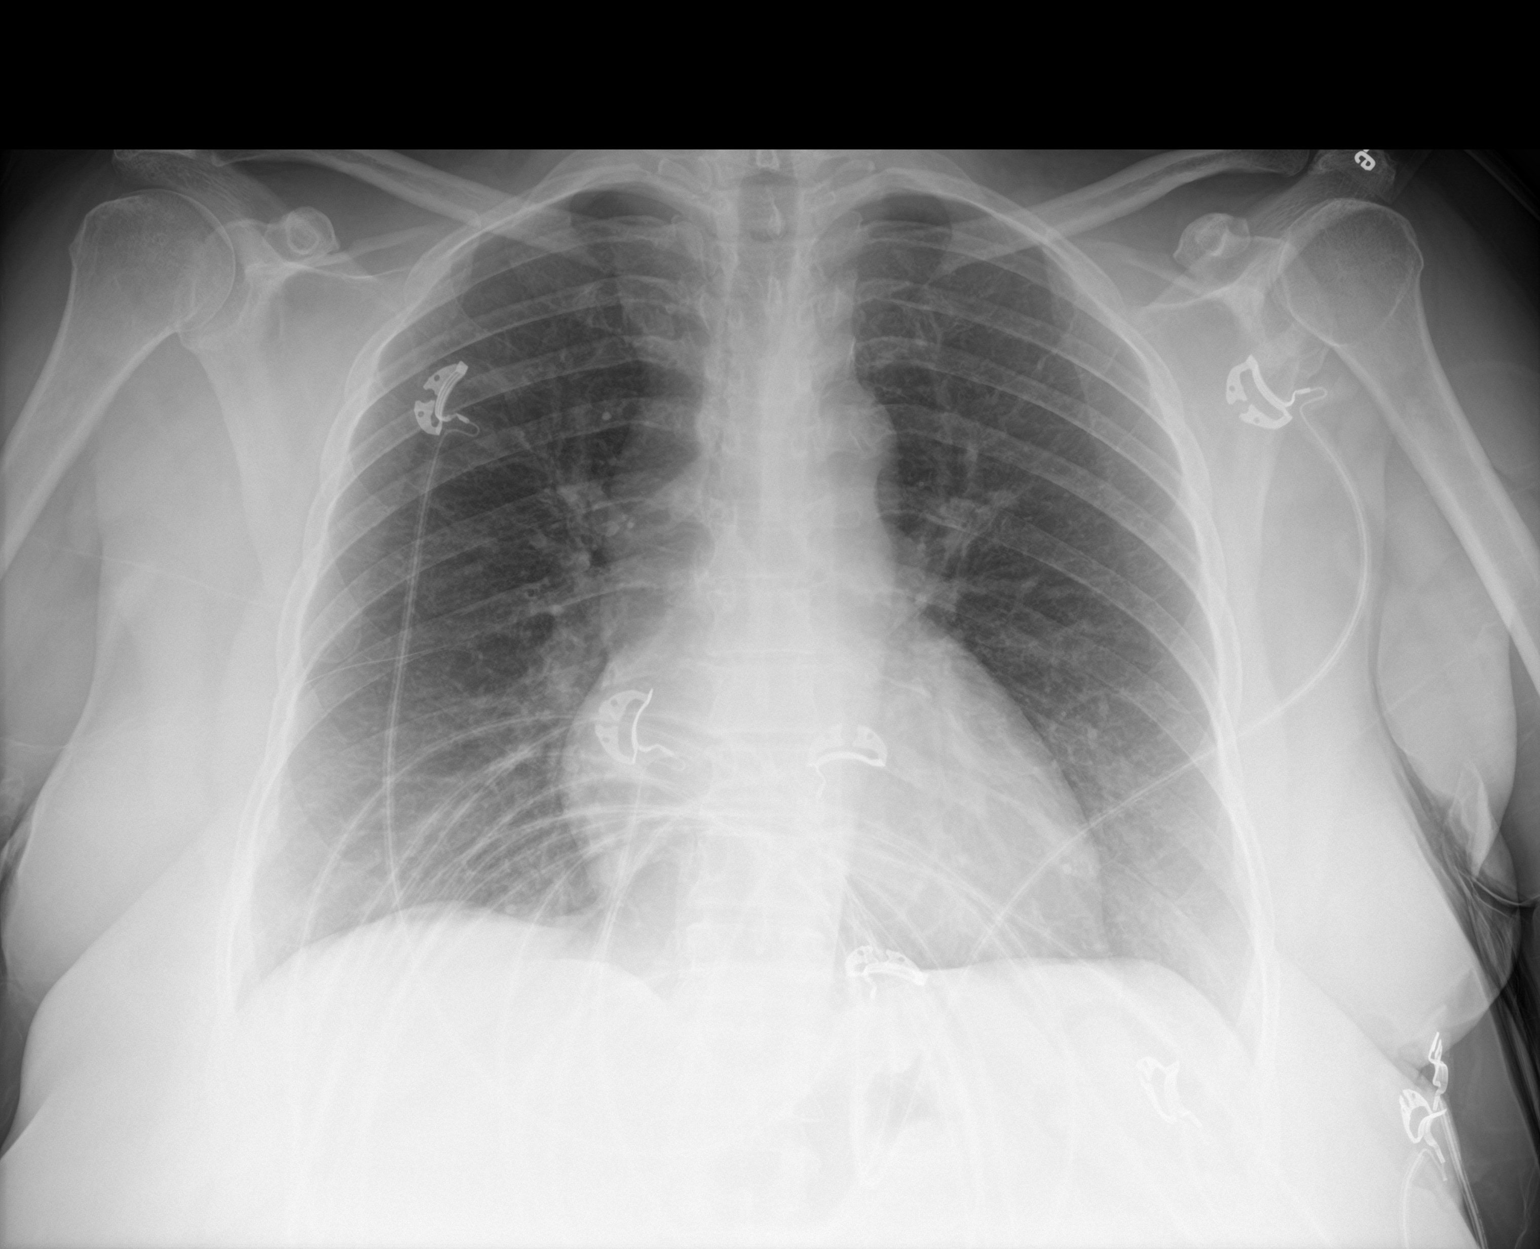

[2 of 2 positions shown; findings below may reference images not displayed]

FINDINGS: The cardiac silhouette, mediastinal and hilar contours are within
normal limits and stable. The lungs are clear. No pleural effusion.
The bony thorax is intact.
IMPRESSION: No acute cardiopulmonary findings.

## 2023-09-03 ENCOUNTER — Ambulatory Visit (INDEPENDENT_AMBULATORY_CARE_PROVIDER_SITE_OTHER): Payer: Medicare Other | Admitting: Plastic Surgery

## 2023-09-03 ENCOUNTER — Encounter: Payer: Self-pay | Admitting: Plastic Surgery

## 2023-09-03 VITALS — BP 132/82 | HR 93 | Ht 60.0 in | Wt 177.0 lb

## 2023-09-03 DIAGNOSIS — M549 Dorsalgia, unspecified: Secondary | ICD-10-CM | POA: Insufficient documentation

## 2023-09-03 DIAGNOSIS — M546 Pain in thoracic spine: Secondary | ICD-10-CM | POA: Diagnosis not present

## 2023-09-03 DIAGNOSIS — G8929 Other chronic pain: Secondary | ICD-10-CM

## 2023-09-03 DIAGNOSIS — M793 Panniculitis, unspecified: Secondary | ICD-10-CM | POA: Insufficient documentation

## 2023-09-03 NOTE — Progress Notes (Signed)
 Patient ID: Nicole Solis, female    DOB: December 02, 1960, 63 y.o.   MRN: [id number]   Chief Complaint  Patient presents with   consult    The patient is a 63 year old female here with her daughter for evaluation of her abdomen.  She is 5 feet tall and weighs 177 pounds.  She has a past medical history of heart disease with a coronary artery bypass graft and 4 C-sections.  She has hypertension and diabetes.  She quit smoking 2 years ago.  She is on Ozempic for her diabetes.  Her last hemoglobin A1c was in the 6 range.  I do not feel any hernia.  But she is got a lot of upper abdominal girth and volume.  She has a little bit of a pendulous pannus.  She complains of skin breakdown and irritation.  Her skin is darker in the skin crease area.  She has not been to healthy weight and wellness yet.    Review of Systems  Constitutional:  Positive for activity change. Negative for appetite change.  HENT: Negative.    Eyes: Negative.   Respiratory: Negative.    Cardiovascular: Negative.   Gastrointestinal: Negative.   Endocrine: Negative.   Genitourinary: Negative.   Musculoskeletal:  Positive for back pain and neck pain.  Skin:  Positive for rash.    Past Medical History:  Diagnosis Date   Diabetes mellitus without complication (HCC)    Hypertension    MI    x 3   Myocardial infarct [facility])     Past Surgical History:  Procedure Laterality Date   cardiac stents     x 3   CESAREAN SECTION     x 4   CORONARY ARTERY BYPASS GRAFT        Current Outpatient Medications:    aspirin 81 MG tablet, Take 81 mg by mouth daily., Disp: , Rfl:    atorvastatin (LIPITOR) 80 MG tablet, Take 80 mg by mouth daily., Disp: , Rfl:    clopidogrel (PLAVIX) 75 MG tablet, Take 75 mg by mouth daily., Disp: , Rfl:    gabapentin (NEURONTIN) 100 MG capsule, Take 100 mg by mouth 3 (three) times daily., Disp: , Rfl:    HYDROcodone-acetaminophen (NORCO) 5-325 MG per tablet, Take 1 tablet by mouth every 6 (six)  hours as needed for moderate pain., Disp: 20 tablet, Rfl: 0   lisinopril (PRINIVIL,ZESTRIL) 2.5 MG tablet, Take 2.5 mg by mouth daily., Disp: , Rfl:    metFORMIN (GLUCOPHAGE) 500 MG tablet, Take by mouth 2 (two) times daily with a meal., Disp: , Rfl:    metoprolol (LOPRESSOR) 50 MG tablet, Take 50 mg by mouth 2 (two) times daily., Disp: , Rfl:    OXYCODONE HCL PO, Take by mouth., Disp: , Rfl:    OZEMPIC, 1 MG/DOSE, 4 MG/3ML SOPN, , Disp: , Rfl:    sertraline (ZOLOFT) 50 MG tablet, Take 50 mg by mouth daily., Disp: , Rfl:    azithromycin (ZITHROMAX Z-PAK) 250 MG tablet, 2 po day one, then 1 daily x 4 days, Disp: 5 tablet, Rfl: 0   benzonatate (TESSALON) 100 MG capsule, Take 1 capsule (100 mg total) by mouth 3 (three) times daily as needed for cough., Disp: 21 capsule, Rfl: 0   clindamycin (CLEOCIN) 150 MG capsule, Take 1 capsule (150 mg total) by mouth 3 (three) times daily., Disp: 21 capsule, Rfl: 0   LORazepam (ATIVAN) 1 MG tablet, Take 1 tablet (1 mg total) by mouth  every 6 (six) hours as needed for anxiety., Disp: 5 tablet, Rfl: 0   nitroGLYCERIN (NITROSTAT) 0.4 MG SL tablet, Place 0.4 mg under the tongue every 5 (five) minutes as needed for chest pain. (Patient not taking: Reported on 09/03/2023), Disp: , Rfl:    predniSONE (DELTASONE) 20 MG tablet, Take 40 mg daily x 2 days then 20 mg daily x 2 days, Disp: 6 tablet, Rfl: 0   Objective:   Vitals:   09/03/23 1144  BP: 132/82  Pulse: 93  SpO2: 98%    Physical Exam Vitals and nursing note reviewed.  Constitutional:      Appearance: Normal appearance.  HENT:     Head: Normocephalic and atraumatic.  Cardiovascular:     Rate and Rhythm: Normal rate.     Pulses: Normal pulses.  Pulmonary:     Effort: Pulmonary effort is normal.  Abdominal:     General: There is no distension.     Palpations: Abdomen is soft.     Hernia: No hernia is present.  Musculoskeletal:        General: No swelling or tenderness.  Skin:    General: Skin is  warm.     Capillary Refill: Capillary refill takes less than 2 seconds.     Coloration: Skin is not jaundiced.     Findings: No bruising or lesion.  Neurological:     Mental Status: She is alert and oriented to person, place, and time.  Psychiatric:        Mood and Affect: Mood normal.        Behavior: Behavior normal.     Assessment & Plan:  Panniculitis  Chronic bilateral thoracic back pain  Recommend a consult with a healthy weight and wellness department.  The patient needs to decrease her weight and inner abdominal girth prior to surgery.  This will help her have a much better result then if we were to do it now.  We will plan to see the patient back once she starts losing weight.  Pictures were obtained of the patient and placed in the chart with the patient's or guardian's permission.   Alena Bills Winda Summerall, DO
